# Patient Record
Sex: Female | Born: 1990 | Race: Black or African American | Hispanic: No | Marital: Single | Smoking: Current every day smoker
Health system: Southern US, Community
[De-identification: ages and names within clinical notes are randomized; demographics above are authoritative.]

## PROBLEM LIST (undated history)

## (undated) DIAGNOSIS — I10 Essential (primary) hypertension: Secondary | ICD-10-CM

## (undated) HISTORY — PX: NO PAST SURGERIES: SHX2092

---

## 2017-10-04 ENCOUNTER — Encounter (HOSPITAL_BASED_OUTPATIENT_CLINIC_OR_DEPARTMENT_OTHER): Payer: Self-pay | Admitting: Emergency Medicine

## 2017-10-04 ENCOUNTER — Other Ambulatory Visit: Payer: Self-pay

## 2017-10-04 ENCOUNTER — Emergency Department (HOSPITAL_BASED_OUTPATIENT_CLINIC_OR_DEPARTMENT_OTHER)
Admission: EM | Admit: 2017-10-04 | Discharge: 2017-10-04 | Disposition: A | Discharge status: Home / Self Care | Payer: Self-pay | Attending: Emergency Medicine | Admitting: Emergency Medicine

## 2017-10-04 DIAGNOSIS — N939 Abnormal uterine and vaginal bleeding, unspecified: Secondary | ICD-10-CM | POA: Insufficient documentation

## 2017-10-04 DIAGNOSIS — F1210 Cannabis abuse, uncomplicated: Secondary | ICD-10-CM | POA: Insufficient documentation

## 2017-10-04 DIAGNOSIS — Z87891 Personal history of nicotine dependence: Secondary | ICD-10-CM | POA: Insufficient documentation

## 2017-10-04 DIAGNOSIS — F418 Other specified anxiety disorders: Secondary | ICD-10-CM | POA: Insufficient documentation

## 2017-10-04 DIAGNOSIS — I10 Essential (primary) hypertension: Secondary | ICD-10-CM | POA: Insufficient documentation

## 2017-10-04 DIAGNOSIS — R1033 Periumbilical pain: Secondary | ICD-10-CM | POA: Insufficient documentation

## 2017-10-04 DIAGNOSIS — K5900 Constipation, unspecified: Secondary | ICD-10-CM | POA: Insufficient documentation

## 2017-10-04 DIAGNOSIS — Z79899 Other long term (current) drug therapy: Secondary | ICD-10-CM | POA: Insufficient documentation

## 2017-10-04 HISTORY — DX: Essential (primary) hypertension: I10

## 2017-10-04 LAB — CBC WITH DIFFERENTIAL/PLATELET
BASOS PCT: 0 %
Basophils Absolute: 0 10*3/uL (ref 0.0–0.1)
Eosinophils Absolute: 0 10*3/uL (ref 0.0–0.7)
Eosinophils Relative: 0 %
HEMATOCRIT: 36.1 % (ref 36.0–46.0)
HEMOGLOBIN: 12.4 g/dL (ref 12.0–15.0)
Lymphocytes Relative: 26 %
Lymphs Abs: 1.9 10*3/uL (ref 0.7–4.0)
MCH: 30.5 pg (ref 26.0–34.0)
MCHC: 34.3 g/dL (ref 30.0–36.0)
MCV: 88.7 fL (ref 78.0–100.0)
MONO ABS: 0.6 10*3/uL (ref 0.1–1.0)
MONOS PCT: 8 %
NEUTROS ABS: 4.9 10*3/uL (ref 1.7–7.7)
NEUTROS PCT: 66 %
Platelets: 224 10*3/uL (ref 150–400)
RBC: 4.07 MIL/uL (ref 3.87–5.11)
RDW: 11.7 % (ref 11.5–15.5)
WBC: 7.4 10*3/uL (ref 4.0–10.5)

## 2017-10-04 LAB — COMPREHENSIVE METABOLIC PANEL
ALBUMIN: 4.8 g/dL (ref 3.5–5.0)
ALT: 15 U/L (ref 14–54)
ANION GAP: 11 (ref 5–15)
AST: 32 U/L (ref 15–41)
Alkaline Phosphatase: 56 U/L (ref 38–126)
BUN: 11 mg/dL (ref 6–20)
CHLORIDE: 104 mmol/L (ref 101–111)
CO2: 23 mmol/L (ref 22–32)
Calcium: 9.6 mg/dL (ref 8.9–10.3)
Creatinine, Ser: 0.79 mg/dL (ref 0.44–1.00)
GFR calc non Af Amer: >60 mL/min (ref >60)
GLUCOSE: 105 mg/dL — AB (ref 65–99)
POTASSIUM: 3.3 mmol/L — AB (ref 3.5–5.1)
SODIUM: 138 mmol/L (ref 135–145)
Total Bilirubin: 0.7 mg/dL (ref 0.3–1.2)
Total Protein: 9.1 g/dL — ABNORMAL HIGH (ref 6.5–8.1)

## 2017-10-04 LAB — URINALYSIS, MICROSCOPIC (REFLEX)

## 2017-10-04 LAB — LIPASE, BLOOD: LIPASE: 27 U/L (ref 11–51)

## 2017-10-04 LAB — WET PREP, GENITAL
Sperm: NONE SEEN
TRICH WET PREP: NONE SEEN
Yeast Wet Prep HPF POC: NONE SEEN

## 2017-10-04 LAB — URINALYSIS, ROUTINE W REFLEX MICROSCOPIC
Bilirubin Urine: NEGATIVE
GLUCOSE, UA: NEGATIVE mg/dL
KETONES UR: NEGATIVE mg/dL
LEUKOCYTES UA: NEGATIVE
NITRITE: NEGATIVE
PH: 6.5 (ref 5.0–8.0)
Protein, ur: NEGATIVE mg/dL

## 2017-10-04 MED ORDER — HYDROXYZINE HCL 25 MG PO TABS: 25.0000 mg | ORAL_TABLET | Freq: Four times a day (QID) | ORAL | 0 refills | Status: DC

## 2017-10-04 MED ORDER — LORAZEPAM 1 MG PO TABS
1.0000 mg | ORAL_TABLET | Freq: Once | ORAL | Status: AC
  Administered 2017-10-04: 1 mg via ORAL
  Filled 2017-10-04: qty 1

## 2017-10-04 NOTE — ED Provider Notes (Signed)
[facility] [facility] EMERGENCY DEPARTMENT Provider Note   CSN: [id number] Arrival date & time: 10/04/17  1640     History   Chief Complaint Chief Complaint  Patient presents with  . Anxiety    HPI Teresa Roman is a 27 y.o. female who presents with anxiety. PMH significant for HTN. Her boyfriend is at bedside. She states she has had feelings of restlessness, depression, and paranoia about a medical condition for the past month. Over the past day her symptoms have acutely worsened. She had an episode of chest pressure and SOB earlier which has subsided. She states this was a "panic attack". She thinks her symptoms may be due to the Nexplanon in her arm which she has had for over a year. She has also had periumbilical abdominal pain, constipation, and vaginal bleeding for the past month. She has been drinking excessive amounts of water to stay hydrated. The patient is a difficult historian with various symptoms/complaints and I am unable to get many details out of her history. She denies SI/HI, AVH. No prior psychiatric diagnosis  HPI  Past Medical History:  Diagnosis Date  . Hypertension     There are no active problems to display for this patient.   History reviewed. No pertinent surgical history.  OB History    No data available       Home Medications    Prior to Admission medications   Medication Sig Start Date End Date Taking? Authorizing Provider  LISINOPRIL PO Take by mouth.   Yes [provider]    Family History History reviewed. No pertinent family history.  Social History Social History   Tobacco Use  . Smoking status: Former Research scientist (life sciences)  . Smokeless tobacco: Never Used  Substance Use Topics  . Alcohol use: Yes    Comment: occ  . Drug use: Yes    Types: Marijuana     Allergies   Patient has no known allergies.   Review of Systems Review of Systems  Constitutional: Positive for appetite change. Negative for chills and fever.  HENT:  Negative for congestion and ear pain.   Respiratory: Positive for shortness of breath. Negative for cough.   Cardiovascular: Positive for chest pain.  Gastrointestinal: Positive for abdominal pain and constipation. Negative for diarrhea, nausea and vomiting.  Endocrine: Positive for polydipsia.  Genitourinary: Negative for decreased urine volume and dysuria.  Neurological: Negative for syncope and headaches.  Psychiatric/Behavioral: Positive for sleep disturbance. The patient is nervous/anxious.      Physical Exam Updated Vital Signs BP (!) 150/115 (BP Location: Right Arm)   Pulse 90   Temp 99.3 F (37.4 C) (Oral)   Resp 18   LMP 09/09/2017   SpO2 99%   Physical Exam  Constitutional: She is oriented to person, place, and time. She appears well-developed and well-nourished. She is cooperative. No distress.  HENT:  Head: Normocephalic and atraumatic.  Eyes: Conjunctivae are normal. Pupils are equal, round, and reactive to light. Right eye exhibits no discharge. Left eye exhibits no discharge. No scleral icterus.  Neck: Normal range of motion.  Cardiovascular: Normal rate and regular rhythm. Exam reveals no gallop and no friction rub.  No murmur heard. Pulmonary/Chest: Effort normal and breath sounds normal. No stridor. No respiratory distress. She has no wheezes. She has no rales. She exhibits no tenderness.  Abdominal: Soft. Bowel sounds are normal. She exhibits no distension. There is tenderness (generalized).  Genitourinary:  Genitourinary Comments: Pelvic: No inguinal lymphadenopathy or inguinal hernia noted. Normal  external genitalia. No pain with speculum insertion. Closed cervical os with normal appearance - no rash or lesions. No significant discharge or bleeding noted from cervix or in vaginal vault. On bimanual examination no adnexal tenderness or cervical motion tenderness. Chaperone present during exam.    Neurological: She is alert and oriented to person, place, and time.   Skin: Skin is warm and dry.  Psychiatric: She has a normal mood and affect. Her behavior is normal.  Nursing note and vitals reviewed.   ED Treatments / Results  Labs (all labs ordered are listed, but only abnormal results are displayed) Labs Reviewed  WET PREP, GENITAL - Abnormal; Notable for the following components:      Result Value   Clue Cells Wet Prep HPF POC PRESENT (*)    WBC, Wet Prep HPF POC FEW (*)    All other components within normal limits  COMPREHENSIVE METABOLIC PANEL - Abnormal; Notable for the following components:   Potassium 3.3 (*)    Glucose, Bld 105 (*)    Total Protein 9.1 (*)    All other components within normal limits  URINALYSIS, ROUTINE W REFLEX MICROSCOPIC - Abnormal; Notable for the following components:   Specific Gravity, Urine <1.005 (*)    Hgb urine dipstick MODERATE (*)    All other components within normal limits  URINALYSIS, MICROSCOPIC (REFLEX) - Abnormal; Notable for the following components:   Bacteria, UA RARE (*)    Squamous Epithelial / LPF 0-5 (*)    All other components within normal limits  CBC WITH DIFFERENTIAL/PLATELET  LIPASE, BLOOD  GC/CHLAMYDIA PROBE AMP ([place]) NOT AT [facility]    EKG  EKG Interpretation None       Radiology No results found.  Procedures Procedures (including critical care time)  Medications Ordered in ED Medications  LORazepam (ATIVAN) tablet 1 mg (1 mg Oral Given 10/04/17 1929)     Initial Impression / Assessment and Plan / ED Course  I have reviewed the triage vital signs and the nursing notes.  Pertinent labs & imaging results that were available during my care of the patient were reviewed by me and considered in my medical decision making (see chart for details).  27 year old female presents with restlessness/anxiety. She is hypertensive but otherwise vitals are normal. Exam is overall unremarkable. CBC is normal. CMP is remarkable for mild hypokalemia. UA is remarkable for low  specific gravity (<1.005). Wet prep is remarkable for clue cells and few WBC. She was given Ativan with good relief of anxiety/restlessness symptoms. She was offered psych eval but declined. She was given referral to [facility] and resources for outpatient counseling. She was given an rx for Atarax. Return precautions given.  Final Clinical Impressions(s) / ED Diagnoses   Final diagnoses:  Anxiety about health    ED Discharge Orders    None       Recardo Evangelist, PA-C 10/04/17 2311    Blanchie Dessert, MD 10/04/17 2318

## 2017-10-04 NOTE — Discharge Instructions (Signed)
Take Hydroxyzine as needed for anxiety or insomnia

## 2017-10-04 NOTE — ED Notes (Addendum)
Pt tossing and turning back and forth on stretcher. Laughing continuously and reporting several different complaints. Family member very supportive at bedside.

## 2017-10-04 NOTE — ED Triage Notes (Signed)
Pt c/o anxiety and fidgeting today; sts she can't sit still; reports abd pain and chest pressure

## 2017-10-05 ENCOUNTER — Emergency Department (HOSPITAL_BASED_OUTPATIENT_CLINIC_OR_DEPARTMENT_OTHER)
Admission: EM | Admit: 2017-10-05 | Discharge: 2017-10-05 | Disposition: A | Discharge status: Home / Self Care | Payer: Self-pay | Attending: Emergency Medicine | Admitting: Emergency Medicine

## 2017-10-05 ENCOUNTER — Other Ambulatory Visit: Payer: Self-pay

## 2017-10-05 ENCOUNTER — Encounter (HOSPITAL_BASED_OUTPATIENT_CLINIC_OR_DEPARTMENT_OTHER): Payer: Self-pay | Admitting: Adult Health

## 2017-10-05 DIAGNOSIS — K5900 Constipation, unspecified: Secondary | ICD-10-CM | POA: Insufficient documentation

## 2017-10-05 DIAGNOSIS — R1084 Generalized abdominal pain: Secondary | ICD-10-CM | POA: Insufficient documentation

## 2017-10-05 DIAGNOSIS — Z87891 Personal history of nicotine dependence: Secondary | ICD-10-CM | POA: Insufficient documentation

## 2017-10-05 DIAGNOSIS — Z79899 Other long term (current) drug therapy: Secondary | ICD-10-CM | POA: Insufficient documentation

## 2017-10-05 DIAGNOSIS — I10 Essential (primary) hypertension: Secondary | ICD-10-CM | POA: Insufficient documentation

## 2017-10-05 DIAGNOSIS — F1210 Cannabis abuse, uncomplicated: Secondary | ICD-10-CM | POA: Insufficient documentation

## 2017-10-05 DIAGNOSIS — N939 Abnormal uterine and vaginal bleeding, unspecified: Secondary | ICD-10-CM | POA: Insufficient documentation

## 2017-10-05 DIAGNOSIS — R410 Disorientation, unspecified: Secondary | ICD-10-CM | POA: Insufficient documentation

## 2017-10-05 DIAGNOSIS — M549 Dorsalgia, unspecified: Secondary | ICD-10-CM | POA: Insufficient documentation

## 2017-10-05 DIAGNOSIS — F419 Anxiety disorder, unspecified: Secondary | ICD-10-CM | POA: Insufficient documentation

## 2017-10-05 LAB — GC/CHLAMYDIA PROBE AMP (~~LOC~~) NOT AT ARMC
Chlamydia: NEGATIVE
NEISSERIA GONORRHEA: NEGATIVE

## 2017-10-05 LAB — RAPID URINE DRUG SCREEN, HOSP PERFORMED
AMPHETAMINES: NOT DETECTED
BENZODIAZEPINES: NOT DETECTED
Barbiturates: NOT DETECTED
Cocaine: NOT DETECTED
OPIATES: NOT DETECTED
Tetrahydrocannabinol: POSITIVE — AB

## 2017-10-05 LAB — PREGNANCY, URINE: PREG TEST UR: NEGATIVE

## 2017-10-05 MED ORDER — CAPSAICIN 0.1 % EX CREA: 1.0000 "application " | TOPICAL_CREAM | Freq: Three times a day (TID) | CUTANEOUS | 1 refills | Status: DC | PRN

## 2017-10-05 MED ORDER — ONDANSETRON 4 MG PO TBDP
4.0000 mg | ORAL_TABLET | Freq: Once | ORAL | Status: AC | PRN
  Administered 2017-10-05: 4 mg via ORAL
  Filled 2017-10-05: qty 1

## 2017-10-05 MED ORDER — LORAZEPAM 1 MG PO TABS
1.0000 mg | ORAL_TABLET | Freq: Once | ORAL | Status: AC
  Administered 2017-10-05: 1 mg via ORAL
  Filled 2017-10-05: qty 1

## 2017-10-05 MED FILL — CAPZASIN-HP 0.1% CREAM: 0.1 | 14 days supply | Qty: 43 | Fill #0

## 2017-10-05 MED FILL — hydrOXYzine HCL 25 MG TABS: 25 | 3 days supply | Qty: 12 | Fill #0

## 2017-10-05 NOTE — ED Provider Notes (Signed)
[place] [facility] EMERGENCY DEPARTMENT Provider Note   CSN: [id number] Arrival date & time: 10/05/17  1217     History   Chief Complaint Chief Complaint  Patient presents with  . Anxiety    HPI Teresa Roman is a 27 y.o. female.  HPI Patient was seen yesterday.  She is still having symptoms.  She did not yet fill the hydroxyzine prescribed yesterday.  She reports she does feel generally anxious all over.  Stomach is fluttering.  She gets cramping lower abdominal and back pain.  Patient and her boyfriend reports they did stop smoking marijuana about 2 weeks ago and he wonders if this could be some sort of withdrawal.  There is been no diarrhea.  Patient reports some constipation.  She does have a Nexplanon which she has had for about a year.  She reports that just over the past month she started having some spotting and vaginal bleeding periodically.  Her boyfriend reports sometimes she seems confused or does not remember things.  She just moved here and not establish medical care yet.  Patient has history of hypertension. Past Medical History:  Diagnosis Date  . Hypertension     There are no active problems to display for this patient.   History reviewed. No pertinent surgical history.  OB History    No data available       Home Medications    Prior to Admission medications   Medication Sig Start Date End Date Taking? Authorizing Provider  Capsaicin 0.1 % CREA Apply 1 application topically 3 (three) times daily as needed. 10/05/17   Charlesetta Shanks, MD  hydrOXYzine (ATARAX/VISTARIL) 25 MG tablet Take 1 tablet (25 mg total) by mouth every 6 (six) hours. 10/04/17   Recardo Evangelist, PA-C  LISINOPRIL PO Take by mouth.    [provider]    Family History History reviewed. No pertinent family history.  Social History Social History   Tobacco Use  . Smoking status: Former Research scientist (life sciences)  . Smokeless tobacco: Never Used  Substance Use Topics  . Alcohol use: Yes      Comment: occ  . Drug use: Yes    Types: Marijuana     Allergies   Patient has no known allergies.   Review of Systems Review of Systems  10 Systems reviewed and are negative for acute change except as noted in the HPI.  Physical Exam Updated Vital Signs BP (!) 134/95 (BP Location: Left Arm)   Pulse 89   Temp 98.5 F (36.9 C) (Oral)   Resp 18   LMP 09/09/2017   SpO2 100%   Physical Exam  Constitutional: She is oriented to person, place, and time.  Patient is alert.  She is anxious in appearance.  She stands up and then sits down.  She moves about and sits on her boyfriend's lap.  She will put her hands in her face.  Sometimes she will start some hyperventilating.  Patient is nontoxic.  All movements are coordinated and symmetric.  HENT:  Head: Normocephalic and atraumatic.  Nose: Nose normal.  Mouth/Throat: Oropharynx is clear and moist.  Eyes: EOM are normal. Pupils are equal, round, and reactive to light.  4 mm and symmetric.  Brisk responses.  Neck: Neck supple.  Cardiovascular: Normal rate, regular rhythm, normal heart sounds and intact distal pulses.  Pulmonary/Chest: Effort normal and breath sounds normal.  Abdominal: Soft. Bowel sounds are normal. She exhibits no distension.  Patient's abdomen is soft.  There is no guarding.  She endorses some tenderness but it is extremely diffuse.  She keeps rubbing her hands back and forth over her whole abdomen indicate where it uncomfortable.  Musculoskeletal: Normal range of motion. She exhibits no edema or tenderness.  Neurological: She is alert and oriented to person, place, and time. No cranial nerve deficit. She exhibits normal muscle tone. Coordination normal.  Movements are coordinated purposeful symmetric.  Patient does not have tremor.  She is anxious but not exhibiting confusion.  She is up and down from her chair.  Skin: Skin is warm and dry.  Psychiatric:  Very anxious.     ED Treatments / Results   Labs (all labs ordered are listed, but only abnormal results are displayed) Labs Reviewed  RAPID URINE DRUG SCREEN, HOSP PERFORMED - Abnormal; Notable for the following components:      Result Value   Tetrahydrocannabinol POSITIVE (*)    All other components within normal limits  PREGNANCY, URINE    EKG  EKG Interpretation None       Radiology No results found.  Procedures Procedures (including critical care time)  Medications Ordered in ED Medications  ondansetron (ZOFRAN-ODT) disintegrating tablet 4 mg (4 mg Oral Given 10/05/17 1238)  LORazepam (ATIVAN) tablet 1 mg (1 mg Oral Given 10/05/17 1615)     Initial Impression / Assessment and Plan / ED Course  I have reviewed the triage vital signs and the nursing notes.  Pertinent labs & imaging results that were available during my care of the patient were reviewed by me and considered in my medical decision making (see chart for details).      Final Clinical Impressions(s) / ED Diagnoses   Final diagnoses:  Anxiety  Generalized abdominal pain  Recheck after 1 mg of Ativan, the patient is sitting calmly on the roller stool in front of her boyfriend conversing.  She has eye contact and smiles at me as I enter the room.  She is attentive is I am going over discharge instructions and planning.  Diagnostic results from yesterday are within normal limits.  Urine drug screen still test positive for cannabis.  She reports quitting for 2 weeks.  At this time however will provide a prescription for capsaicin that the patient can apply to the abdomen for cramping discomfort if needed.  She is counseled to fill the hydroxyzine as prescribed yesterday if needed for anxiety or panic.  She is counseled on importance of follow-up plan and continued medical care.  ED Discharge Orders        Ordered    Capsaicin 0.1 % CREA  3 times daily PRN     10/05/17 1644       Charlesetta Shanks, MD 10/05/17 1653

## 2017-10-05 NOTE — ED Triage Notes (Addendum)
Presents with same symptoms as last night, pt boyfriend with her, "She is complaining that she can't sleep, that her stomach is fluttering, she has never had anxiety and she gets hesistant and scared to do anything. She is very overwhelmed, being in a new city and bills and taking control of everything" pt speaks very few words and will not make eye contact. They did not get the hydroxyzine filled last night or today because it was too late. Denies thoughts of SI and HI. PT keeps stating that her stomach hurts and she feels nauseated. She has been constipated for the past few days.

## 2020-09-23 ENCOUNTER — Ambulatory Visit (INDEPENDENT_AMBULATORY_CARE_PROVIDER_SITE_OTHER): Payer: Medicaid Other

## 2020-09-23 ENCOUNTER — Other Ambulatory Visit: Payer: Self-pay

## 2020-09-23 VITALS — BP 152/94 | HR 69 | Wt 243.1 lb

## 2020-09-23 DIAGNOSIS — Z3201 Encounter for pregnancy test, result positive: Secondary | ICD-10-CM

## 2020-09-23 DIAGNOSIS — O10919 Unspecified pre-existing hypertension complicating pregnancy, unspecified trimester: Secondary | ICD-10-CM | POA: Diagnosis not present

## 2020-09-23 LAB — CBC
Hematocrit: 34.5 % (ref 34.0–46.6)
Hemoglobin: 11.5 g/dL (ref 11.1–15.9)
MCH: 30.7 pg (ref 26.6–33.0)
MCHC: 33.3 g/dL (ref 31.5–35.7)
MCV: 92 fL (ref 79–97)
Platelets: 209 10*3/uL (ref 150–450)
RBC: 3.74 x10E6/uL — ABNORMAL LOW (ref 3.77–5.28)
RDW: 11.4 % — ABNORMAL LOW (ref 11.7–15.4)
WBC: 6.7 10*3/uL (ref 3.4–10.8)

## 2020-09-23 LAB — POCT PREGNANCY, URINE: Preg Test, Ur: POSITIVE — AB

## 2020-09-23 MED ORDER — LABETALOL HCL 200 MG PO TABS: 200.0000 mg | ORAL_TABLET | Freq: Three times a day (TID) | ORAL | 3 refills | Status: DC

## 2020-09-23 NOTE — Progress Notes (Signed)
Pt here today for pregnancy test resulting positive.  Pt reports that she thinks her LMP is sometime in October she does not keep track of her periods.  Medications/allergies reviewed.  Pt reports having headaches for the past couple of days that Tylenol is effective pain management.   BP LA 163/92.  Rpt BP RA 152/94.  Reviewed BP's with Dr. Rip Harbour who recommends that pt get a CBC, TSH, CMP, PCR, HgbA1C, and start Labetalol 200 mg po tid.  Due to pt unsure of LMP, U/S scheduled for 10/05/20 @ 1045.  Pt informed of provider's recommendation to start taking BP medication.  Pt provided with office BP cuff so that she can start monitoring her BP's at home 2-3 x per week, writing them down, and bringing them to her appts per recommendation of Dr. Rip Harbour.  Pt advised that we will schedule her a NEW [facility]OB appt in about 2-3 weeks due to her chronic HTN.  Pt verbalized understanding.  Mel Almond, RN 1/13/220

## 2020-09-23 NOTE — Progress Notes (Signed)
Agree with A & P. 

## 2020-09-24 LAB — COMPREHENSIVE METABOLIC PANEL
ALT: 7 IU/L (ref 0–32)
AST: 12 IU/L (ref 0–40)
Albumin/Globulin Ratio: 1.4 (ref 1.2–2.2)
Albumin: 4.2 g/dL (ref 3.8–4.8)
Alkaline Phosphatase: 47 IU/L (ref 44–121)
BUN/Creatinine Ratio: 11 (ref 9–23)
BUN: 7 mg/dL (ref 6–20)
Bilirubin Total: 0.3 mg/dL (ref 0.0–1.2)
CO2: 21 mmol/L (ref 20–29)
Calcium: 9.5 mg/dL (ref 8.7–10.2)
Chloride: 101 mmol/L (ref 96–106)
Creatinine, Ser: 0.65 mg/dL (ref 0.57–1.00)
GFR calc Af Amer: 137 mL/min/{1.73_m2} (ref >59)
GFR calc non Af Amer: 119 mL/min/{1.73_m2} (ref >59)
Globulin, Total: 2.9 g/dL (ref 1.5–4.5)
Glucose: 69 mg/dL (ref 65–99)
Potassium: 4.5 mmol/L (ref 3.5–5.2)
Sodium: 137 mmol/L (ref 134–144)
Total Protein: 7.1 g/dL (ref 6.0–8.5)

## 2020-09-24 LAB — HEMOGLOBIN A1C
Est. average glucose Bld gHb Est-mCnc: 85 mg/dL
Hgb A1c MFr Bld: 4.6 % — ABNORMAL LOW (ref 4.8–5.6)

## 2020-09-24 LAB — TSH: TSH: 1.67 u[IU]/mL (ref 0.450–4.500)

## 2020-09-24 LAB — PROTEIN / CREATININE RATIO, URINE
Creatinine, Urine: 75 mg/dL
Protein, Ur: 8.6 mg/dL
Protein/Creat Ratio: 115 mg/g creat (ref 0–200)

## 2020-10-05 ENCOUNTER — Other Ambulatory Visit: Payer: Self-pay

## 2020-10-05 ENCOUNTER — Ambulatory Visit (HOSPITAL_BASED_OUTPATIENT_CLINIC_OR_DEPARTMENT_OTHER): Payer: Medicaid Other

## 2020-10-05 ENCOUNTER — Other Ambulatory Visit: Payer: Self-pay | Admitting: Obstetrics and Gynecology

## 2020-10-05 ENCOUNTER — Ambulatory Visit
Admission: RE | Admit: 2020-10-05 | Discharge: 2020-10-05 | Disposition: A | Discharge status: Home / Self Care | Payer: Medicaid Other | Source: Ambulatory Visit | Attending: Obstetrics and Gynecology | Admitting: Obstetrics and Gynecology

## 2020-10-05 ENCOUNTER — Ambulatory Visit: Payer: Medicaid Other | Admitting: *Deleted

## 2020-10-05 ENCOUNTER — Other Ambulatory Visit: Payer: Self-pay | Admitting: *Deleted

## 2020-10-05 VITALS — BP 142/86 | HR 75 | Ht 66.0 in

## 2020-10-05 DIAGNOSIS — O10919 Unspecified pre-existing hypertension complicating pregnancy, unspecified trimester: Secondary | ICD-10-CM | POA: Diagnosis not present

## 2020-10-05 DIAGNOSIS — I10 Essential (primary) hypertension: Secondary | ICD-10-CM | POA: Diagnosis not present

## 2020-10-05 DIAGNOSIS — Z3201 Encounter for pregnancy test, result positive: Secondary | ICD-10-CM | POA: Diagnosis not present

## 2020-10-05 DIAGNOSIS — O99212 Obesity complicating pregnancy, second trimester: Secondary | ICD-10-CM | POA: Insufficient documentation

## 2020-10-05 DIAGNOSIS — Z3A17 17 weeks gestation of pregnancy: Secondary | ICD-10-CM

## 2020-10-05 DIAGNOSIS — Z362 Encounter for other antenatal screening follow-up: Secondary | ICD-10-CM

## 2020-10-15 ENCOUNTER — Ambulatory Visit (INDEPENDENT_AMBULATORY_CARE_PROVIDER_SITE_OTHER): Payer: Medicaid Other | Admitting: Family Medicine

## 2020-10-15 ENCOUNTER — Other Ambulatory Visit: Payer: Self-pay

## 2020-10-15 ENCOUNTER — Encounter: Payer: Self-pay | Admitting: Family Medicine

## 2020-10-15 ENCOUNTER — Other Ambulatory Visit (HOSPITAL_COMMUNITY)
Admission: RE | Admit: 2020-10-15 | Discharge: 2020-10-15 | Disposition: A | Discharge status: Home / Self Care | Payer: Medicaid Other | Source: Ambulatory Visit | Attending: Family Medicine | Admitting: Family Medicine

## 2020-10-15 DIAGNOSIS — Z98891 History of uterine scar from previous surgery: Secondary | ICD-10-CM | POA: Insufficient documentation

## 2020-10-15 DIAGNOSIS — O0990 Supervision of high risk pregnancy, unspecified, unspecified trimester: Secondary | ICD-10-CM | POA: Diagnosis not present

## 2020-10-15 DIAGNOSIS — Z3482 Encounter for supervision of other normal pregnancy, second trimester: Secondary | ICD-10-CM | POA: Diagnosis not present

## 2020-10-15 DIAGNOSIS — O10919 Unspecified pre-existing hypertension complicating pregnancy, unspecified trimester: Secondary | ICD-10-CM | POA: Insufficient documentation

## 2020-10-15 NOTE — Progress Notes (Signed)
  Subjective:  Teresa Roman is a G1P0000 [redacted]w[redacted]d being seen today for her first obstetrical visit.  Her obstetrical history is significant for [facility]. Was started on labetalol. Is taking baby aspirin. First pregnancy.. Patient does intend to breast feed. Pregnancy history fully reviewed.  Patient reports no complaints.  BP 140/82   Pulse 74   Wt 249 lb (112.9 kg)   BMI 40.19 kg/m   HISTORY: OB History  Gravida Para Term Preterm AB Living  1 0 0 0 0 0  SAB IAB Ectopic Multiple Live Births  0 0 0 0 0    # Outcome Date GA Lbr Len/2nd Weight Sex Delivery Anes PTL Lv  1 Current             Past Medical History:  Diagnosis Date  . Hypertension     Past Surgical History:  Procedure Laterality Date  . NO PAST SURGERIES      Family History  Problem Relation Age of Onset  . Arthritis Mother   . Hypertension Mother   . Cancer Father      Exam  BP 140/82   Pulse 74   Wt 249 lb (112.9 kg)   BMI 40.19 kg/m   Chaperone present during exam  CONSTITUTIONAL: Well-developed, well-nourished female in no acute distress.  HENT:  Normocephalic, atraumatic, External right and left ear normal. Oropharynx is clear and moist EYES: Conjunctivae and EOM are normal. Pupils are equal, round, and reactive to light. No scleral icterus.  NECK: Normal range of motion, supple, no masses.  Normal thyroid.  CARDIOVASCULAR: Normal heart rate noted, regular rhythm RESPIRATORY: Clear to auscultation bilaterally. Effort and breath sounds normal, no problems with respiration noted. BREASTS: declined exam ABDOMEN: Soft, normal bowel sounds, no distention noted.  No tenderness, rebound or guarding.  PELVIC: Normal appearing external genitalia; normal appearing vaginal mucosa and cervix. No abnormal discharge noted. Normal uterine size, no other palpable masses, no uterine or adnexal tenderness. MUSCULOSKELETAL: Normal range of motion. No tenderness.  No cyanosis, clubbing, or edema.  2+ distal  pulses. SKIN: Skin is warm and dry. No rash noted. Not diaphoretic. No erythema. No pallor. NEUROLOGIC: Alert and oriented to person, place, and time. Normal reflexes, muscle tone coordination. No cranial nerve deficit noted. PSYCHIATRIC: Normal mood and affect. Normal behavior. Normal judgment and thought content.    Assessment:    Pregnancy: G1P0000 Patient Active Problem List   Diagnosis Date Noted  . Supervision of high risk pregnancy, antepartum 10/15/2020  . Chronic hypertension affecting pregnancy 10/15/2020      Plan:   1. Supervision of high risk pregnancy, antepartum FHT and FH normal Desires panorama. Discussed practice, collaborative practice with midwives, fellows. [place] already done. PAP today. - CBC/D/Plt+RPR+Rh+ABO+Rub Ab... - Genetic Screening - Culture, OB Urine - Cytology - PAP( [place]) - Babyscripts Schedule Optimization  2. Chronic hypertension affecting pregnancy Continue labetalol, ASA 81mg  Serial [place]. Antenatal testing.     Problem list reviewed and updated. 75% of 30 min visit spent on counseling and coordination of care.     Truett Mainland 10/15/2020

## 2020-10-18 LAB — CYTOLOGY - PAP
Adequacy: ABSENT
Chlamydia: NEGATIVE
Comment: NEGATIVE
Comment: NEGATIVE
Comment: NORMAL
Diagnosis: NEGATIVE
High risk HPV: NEGATIVE
Neisseria Gonorrhea: NEGATIVE

## 2020-10-20 LAB — CBC/D/PLT+RPR+RH+ABO+RUB AB...
Antibody Screen: NEGATIVE
Basophils Absolute: 0 10*3/uL (ref 0.0–0.2)
Basos: 0 %
EOS (ABSOLUTE): 0.1 10*3/uL (ref 0.0–0.4)
Eos: 2 %
HCV Ab: <0.1 s/co ratio (ref 0.0–0.9)
HIV Screen 4th Generation wRfx: NONREACTIVE
Hematocrit: 30.3 % — ABNORMAL LOW (ref 34.0–46.6)
Hemoglobin: 10.2 g/dL — ABNORMAL LOW (ref 11.1–15.9)
Hepatitis B Surface Ag: NEGATIVE
Immature Grans (Abs): 0 10*3/uL (ref 0.0–0.1)
Immature Granulocytes: 0 %
Lymphocytes Absolute: 1.9 10*3/uL (ref 0.7–3.1)
Lymphs: 26 %
MCH: 30.6 pg (ref 26.6–33.0)
MCHC: 33.7 g/dL (ref 31.5–35.7)
MCV: 91 fL (ref 79–97)
Monocytes Absolute: 0.6 10*3/uL (ref 0.1–0.9)
Monocytes: 8 %
Neutrophils Absolute: 4.6 10*3/uL (ref 1.4–7.0)
Neutrophils: 64 %
Platelets: 217 10*3/uL (ref 150–450)
RBC: 3.33 x10E6/uL — ABNORMAL LOW (ref 3.77–5.28)
RDW: 11 % — ABNORMAL LOW (ref 11.7–15.4)
RPR Ser Ql: NONREACTIVE
Rh Factor: POSITIVE
Rubella Antibodies, IGG: 10.8 index (ref >0.99)
WBC: 7.3 10*3/uL (ref 3.4–10.8)

## 2020-10-20 LAB — HCV INTERPRETATION

## 2020-10-21 LAB — URINE CULTURE, OB REFLEX

## 2020-10-21 LAB — CULTURE, OB URINE

## 2020-10-26 ENCOUNTER — Ambulatory Visit: Payer: Medicaid Other | Admitting: *Deleted

## 2020-10-26 ENCOUNTER — Other Ambulatory Visit: Payer: Self-pay | Admitting: *Deleted

## 2020-10-26 ENCOUNTER — Encounter: Payer: Self-pay | Admitting: *Deleted

## 2020-10-26 ENCOUNTER — Ambulatory Visit: Payer: Medicaid Other | Attending: Obstetrics

## 2020-10-26 ENCOUNTER — Other Ambulatory Visit: Payer: Self-pay

## 2020-10-26 DIAGNOSIS — O0990 Supervision of high risk pregnancy, unspecified, unspecified trimester: Secondary | ICD-10-CM

## 2020-10-26 DIAGNOSIS — O10919 Unspecified pre-existing hypertension complicating pregnancy, unspecified trimester: Secondary | ICD-10-CM | POA: Insufficient documentation

## 2020-10-26 DIAGNOSIS — Z362 Encounter for other antenatal screening follow-up: Secondary | ICD-10-CM | POA: Insufficient documentation

## 2020-11-05 ENCOUNTER — Encounter: Payer: Self-pay | Admitting: *Deleted

## 2020-11-12 ENCOUNTER — Other Ambulatory Visit: Payer: Self-pay

## 2020-11-12 ENCOUNTER — Telehealth (INDEPENDENT_AMBULATORY_CARE_PROVIDER_SITE_OTHER): Payer: Medicaid Other | Admitting: Obstetrics & Gynecology

## 2020-11-12 ENCOUNTER — Encounter: Payer: Self-pay | Admitting: Obstetrics & Gynecology

## 2020-11-12 VITALS — BP 138/87 | HR 79

## 2020-11-12 DIAGNOSIS — Q270 Congenital absence and hypoplasia of umbilical artery: Secondary | ICD-10-CM

## 2020-11-12 DIAGNOSIS — O0992 Supervision of high risk pregnancy, unspecified, second trimester: Secondary | ICD-10-CM

## 2020-11-12 DIAGNOSIS — O10912 Unspecified pre-existing hypertension complicating pregnancy, second trimester: Secondary | ICD-10-CM

## 2020-11-12 DIAGNOSIS — O0990 Supervision of high risk pregnancy, unspecified, unspecified trimester: Secondary | ICD-10-CM

## 2020-11-12 DIAGNOSIS — O10919 Unspecified pre-existing hypertension complicating pregnancy, unspecified trimester: Secondary | ICD-10-CM

## 2020-11-12 DIAGNOSIS — Z3A22 22 weeks gestation of pregnancy: Secondary | ICD-10-CM | POA: Diagnosis not present

## 2020-11-12 NOTE — Progress Notes (Signed)
    TELEHEALTH OBSTETRICS VISIT ENCOUNTER NOTE  Provider location: [facility] [employer] [facility] [facility]   I connected with Desma Maxim on 11/12/20 at 10:55 AM EST by telephone at home and verified that I am speaking with the correct person using two identifiers. Of note, unable to do video encounter due to technical difficulties.    I discussed the limitations, risks, security and privacy concerns of performing an evaluation and management service by telephone and the availability of in person appointments. I also discussed with the patient that there may be a patient responsible charge related to this service. The patient expressed understanding and agreed to proceed.  Subjective:  Teresa Roman is a 30 y.o. G1P0000 at [redacted]w[redacted]d being followed for ongoing prenatal care.  She is currently monitored for the following issues for this high-risk pregnancy and has Supervision of high risk pregnancy, antepartum and Chronic hypertension affecting pregnancy on their problem list.  Patient reports had a few hours of chest pain this morning that resolved with massage. Reports fetal movement. Denies any contractions, bleeding or leaking of fluid.   The following portions of the patient's history were reviewed and updated as appropriate: allergies, current medications, past family history, past medical history, past social history, past surgical history and problem list.   Objective:  Blood pressure 138/87, pulse 79. General:  Alert, oriented and cooperative.   Mental Status: Normal mood and affect perceived. Normal judgment and thought content.  Rest of physical exam deferred due to type of encounter  Assessment and Plan:  Pregnancy: G1P0000 at [redacted]w[redacted]d 1. Supervision of high risk pregnancy, antepartum F/u US for growth in 4 weeks Umbilical cord, single artery and vein 2. Chronic hypertension affecting pregnancy Continue labetalol  Preterm labor symptoms and general obstetric  precautions including but not limited to vaginal bleeding, contractions, leaking of fluid and fetal movement were reviewed in detail with the patient.  I discussed the assessment and treatment plan with the patient. The patient was provided an opportunity to ask questions and all were answered. The patient agreed with the plan and demonstrated an understanding of the instructions. The patient was advised to call back or seek an in-person office evaluation/go to [facility] at [facility] for any urgent or concerning symptoms. Please refer to After Visit Summary for other counseling recommendations.   I provided 12 minutes of non-face-to-face time during this encounter.  Return in about 4 weeks (around 12/10/2020) for 2 hr GTT.  Future Appointments  Date Time Provider [place]  12/07/2020  2:15 PM [facility] NURSE [facility]  12/07/2020  2:30 PM [facility] US3 [facility]    Emeterio Reeve, [place] [facility] [place], [place]

## 2020-11-12 NOTE — Patient Instructions (Signed)

## 2020-11-12 NOTE — Progress Notes (Signed)
I connected with  Teresa Roman on 11/12/20 at 10:55 AM EST by MyChart video visit and verified that I am speaking with the correct person using two identifiers.   I discussed the limitations, risks, security and privacy concerns of performing an evaluation and management service by telephone and the availability of in person appointments. I also discussed with the patient that there may be a patient responsible charge related to this service. The patient expressed understanding and agreed to proceed.  [name], CMA 11/12/2020  10:42 AM

## 2020-11-17 ENCOUNTER — Encounter: Payer: Self-pay | Admitting: *Deleted

## 2020-12-07 ENCOUNTER — Ambulatory Visit: Payer: Medicaid Other | Admitting: *Deleted

## 2020-12-07 ENCOUNTER — Ambulatory Visit: Payer: Medicaid Other | Attending: Maternal & Fetal Medicine

## 2020-12-07 ENCOUNTER — Other Ambulatory Visit: Payer: Self-pay | Admitting: *Deleted

## 2020-12-07 ENCOUNTER — Encounter: Payer: Self-pay | Admitting: *Deleted

## 2020-12-07 ENCOUNTER — Other Ambulatory Visit: Payer: Self-pay

## 2020-12-07 DIAGNOSIS — O3412 Maternal care for benign tumor of corpus uteri, second trimester: Secondary | ICD-10-CM | POA: Diagnosis not present

## 2020-12-07 DIAGNOSIS — O10919 Unspecified pre-existing hypertension complicating pregnancy, unspecified trimester: Secondary | ICD-10-CM | POA: Diagnosis not present

## 2020-12-07 DIAGNOSIS — O0990 Supervision of high risk pregnancy, unspecified, unspecified trimester: Secondary | ICD-10-CM

## 2020-12-07 DIAGNOSIS — O321XX0 Maternal care for breech presentation, not applicable or unspecified: Secondary | ICD-10-CM

## 2020-12-07 DIAGNOSIS — E669 Obesity, unspecified: Secondary | ICD-10-CM | POA: Diagnosis not present

## 2020-12-07 DIAGNOSIS — O99212 Obesity complicating pregnancy, second trimester: Secondary | ICD-10-CM | POA: Diagnosis not present

## 2020-12-07 DIAGNOSIS — O10912 Unspecified pre-existing hypertension complicating pregnancy, second trimester: Secondary | ICD-10-CM | POA: Diagnosis not present

## 2020-12-07 DIAGNOSIS — O368920 Maternal care for other specified fetal problems, second trimester, not applicable or unspecified: Secondary | ICD-10-CM | POA: Diagnosis not present

## 2020-12-07 DIAGNOSIS — D259 Leiomyoma of uterus, unspecified: Secondary | ICD-10-CM | POA: Diagnosis not present

## 2020-12-07 DIAGNOSIS — Z3A26 26 weeks gestation of pregnancy: Secondary | ICD-10-CM | POA: Diagnosis not present

## 2021-01-04 ENCOUNTER — Ambulatory Visit: Payer: Medicaid Other | Attending: Obstetrics and Gynecology

## 2021-01-04 ENCOUNTER — Encounter: Payer: Self-pay | Admitting: *Deleted

## 2021-01-04 ENCOUNTER — Other Ambulatory Visit: Payer: Self-pay

## 2021-01-04 ENCOUNTER — Ambulatory Visit: Payer: Medicaid Other | Admitting: *Deleted

## 2021-01-04 DIAGNOSIS — Z362 Encounter for other antenatal screening follow-up: Secondary | ICD-10-CM

## 2021-01-04 DIAGNOSIS — Z3A30 30 weeks gestation of pregnancy: Secondary | ICD-10-CM

## 2021-01-04 DIAGNOSIS — O3413 Maternal care for benign tumor of corpus uteri, third trimester: Secondary | ICD-10-CM

## 2021-01-04 DIAGNOSIS — O289 Unspecified abnormal findings on antenatal screening of mother: Secondary | ICD-10-CM

## 2021-01-04 DIAGNOSIS — O0990 Supervision of high risk pregnancy, unspecified, unspecified trimester: Secondary | ICD-10-CM | POA: Insufficient documentation

## 2021-01-04 DIAGNOSIS — O10919 Unspecified pre-existing hypertension complicating pregnancy, unspecified trimester: Secondary | ICD-10-CM

## 2021-01-04 DIAGNOSIS — O99213 Obesity complicating pregnancy, third trimester: Secondary | ICD-10-CM

## 2021-01-04 DIAGNOSIS — O10913 Unspecified pre-existing hypertension complicating pregnancy, third trimester: Secondary | ICD-10-CM | POA: Diagnosis not present

## 2021-01-04 DIAGNOSIS — D259 Leiomyoma of uterus, unspecified: Secondary | ICD-10-CM

## 2021-01-04 DIAGNOSIS — E669 Obesity, unspecified: Secondary | ICD-10-CM

## 2021-01-05 ENCOUNTER — Other Ambulatory Visit: Payer: Self-pay | Admitting: *Deleted

## 2021-01-05 DIAGNOSIS — Q270 Congenital absence and hypoplasia of umbilical artery: Secondary | ICD-10-CM

## 2021-01-12 ENCOUNTER — Other Ambulatory Visit: Payer: Self-pay | Admitting: *Deleted

## 2021-01-12 DIAGNOSIS — O0990 Supervision of high risk pregnancy, unspecified, unspecified trimester: Secondary | ICD-10-CM

## 2021-01-13 ENCOUNTER — Ambulatory Visit (INDEPENDENT_AMBULATORY_CARE_PROVIDER_SITE_OTHER): Payer: Medicaid Other | Admitting: Family Medicine

## 2021-01-13 ENCOUNTER — Other Ambulatory Visit: Payer: Medicaid Other

## 2021-01-13 ENCOUNTER — Other Ambulatory Visit: Payer: Self-pay

## 2021-01-13 VITALS — BP 136/80 | HR 90 | Wt 270.0 lb

## 2021-01-13 DIAGNOSIS — Z23 Encounter for immunization: Secondary | ICD-10-CM

## 2021-01-13 DIAGNOSIS — Q270 Congenital absence and hypoplasia of umbilical artery: Secondary | ICD-10-CM

## 2021-01-13 DIAGNOSIS — O10919 Unspecified pre-existing hypertension complicating pregnancy, unspecified trimester: Secondary | ICD-10-CM

## 2021-01-13 DIAGNOSIS — O0930 Supervision of pregnancy with insufficient antenatal care, unspecified trimester: Secondary | ICD-10-CM

## 2021-01-13 DIAGNOSIS — O0990 Supervision of high risk pregnancy, unspecified, unspecified trimester: Secondary | ICD-10-CM

## 2021-01-13 MED ORDER — ASPIRIN 81 MG PO CHEW
81.0000 mg | CHEWABLE_TABLET | Freq: Every day | ORAL | Status: DC
Start: 2021-01-13 — End: 2021-03-08

## 2021-01-13 MED ORDER — LABETALOL HCL 200 MG PO TABS: 400.0000 mg | ORAL_TABLET | Freq: Two times a day (BID) | ORAL | 3 refills | Status: DC

## 2021-01-13 NOTE — Progress Notes (Signed)
   PRENATAL VISIT NOTE  Subjective:  Teresa Roman is a 30 y.o. G1P0000 at [redacted]w[redacted]d being seen today for ongoing prenatal care.  She is currently monitored for the following issues for this high-risk pregnancy and has Supervision of high risk pregnancy, antepartum; Chronic hypertension affecting pregnancy; Umbilical cord, single artery and vein; and Late prenatal care on their problem list.  Patient reports no complaints.  Contractions: Not present. Vag. Bleeding: None.  Movement: Present. Denies leaking of fluid.   The following portions of the patient's history were reviewed and updated as appropriate: allergies, current medications, past family history, past medical history, past social history, past surgical history and problem list.   Objective:   Vitals:   01/13/21 0916  BP: 136/80  Pulse: 90  Weight: 270 lb (122.5 kg)    Fetal Status: Fetal Heart Rate (bpm): 154   Movement: Present     General:  Alert, oriented and cooperative. Patient is in no acute distress.  Skin: Skin is warm and dry. No rash noted.   Cardiovascular: Normal heart rate noted  Respiratory: Normal respiratory effort, no problems with respiration noted  Abdomen: Soft, gravid, appropriate for gestational age.  Pain/Pressure: Absent     Pelvic: Cervical exam deferred        Extremities: Normal range of motion.  Edema: None  Mental Status: Normal mood and affect. Normal behavior. Normal judgment and thought content.   Assessment and Plan:  Pregnancy: G1P0000 at [redacted]w[redacted]d 1. Umbilical cord, single artery and vein Following with MFM for serial growth u/s, next scheduled 01/18/21.  2. Supervision of high risk pregnancy, antepartum -Doing well without complaints -BP elevated, otherwise VSS -taking PNV and bASA -Discussed contraception, undecided at this time, has previously had nexplanon and did not like. Considering liletta vs OCPs vs depo. Will refer to bedsider.org -Received Tdap today, will return for 28 week labs  given not fasting.  3. Chronic hypertension affecting pregnancy -Currently prescribed labetalol 200 mg TID, only able to remember to take 2 doses daily. -Asymptomatic -Today BP mildly elevated, patient needs refill of medication, will switch to labetalol 400 mg BID for compliance -Will collect repeat preE labs with 28 week labs  3. Maternal obesity -BMI 43  Preterm labor symptoms and general obstetric precautions including but not limited to vaginal bleeding, contractions, leaking of fluid and fetal movement were reviewed in detail with the patient. Please refer to After Visit Summary for other counseling recommendations.   Return in about 2 weeks (around 01/27/2021) for [facility]; in person.  Future Appointments  Date Time Provider [place]  01/14/2021  8:20 AM [facility] LAB [facility]  01/18/2021  2:15 PM [facility] NURSE [facility]  01/18/2021  2:30 PM [facility] US3 [facility]  01/24/2021 12:30 PM [facility] NURSE [facility]  01/24/2021 12:45 PM [facility] US6 [facility]  02/02/2021  2:15 PM [facility] NURSE [facility]  02/02/2021  2:30 PM [facility] US3 [facility] [place]    Arrie Senate, MD

## 2021-01-13 NOTE — Patient Instructions (Addendum)
-continue baby aspirin daily -take labetalol 400 mg twice daily -come back tomorrow for lab work, fasting -bedsider.org to look up birth control options  Tdap (Tetanus, Diphtheria, Pertussis) Vaccine: What You Need to Know 1. Why get vaccinated? Tdap vaccine can prevent tetanus, diphtheria, and pertussis. Diphtheria and pertussis spread from person to person. Tetanus enters the body through cuts or wounds.  TETANUS (T) causes painful stiffening of the muscles. Tetanus can lead to serious health problems, including being unable to open the mouth, having trouble swallowing and breathing, or death.  DIPHTHERIA (D) can lead to difficulty breathing, heart failure, paralysis, or death.  PERTUSSIS (aP), also known as "whooping cough," can cause uncontrollable, violent coughing that makes it hard to breathe, eat, or drink. Pertussis can be extremely serious especially in babies and young children, causing pneumonia, convulsions, brain damage, or death. In teens and adults, it can cause weight loss, loss of bladder control, passing out, and rib fractures from severe coughing. 2. Tdap vaccine Tdap is only for children 7 years and older, adolescents, and adults.  Adolescents should receive a single dose of Tdap, preferably at age 64 or 45 years. Pregnant people should get a dose of Tdap during every pregnancy, preferably during the early part of the third trimester, to help protect the newborn from pertussis. Infants are most at risk for severe, life-threatening complications from pertussis. Adults who have never received Tdap should get a dose of Tdap. Also, adults should receive a booster dose of either Tdap or Td (a different vaccine that protects against tetanus and diphtheria but not pertussis) every 10 years, or after 5 years in the case of a severe or dirty wound or burn. Tdap may be given at the same time as other vaccines. 3. Talk with your health care provider Tell your vaccine provider if the  person getting the vaccine:  Has had an allergic reaction after a previous dose of any vaccine that protects against tetanus, diphtheria, or pertussis, or has any severe, life-threatening allergies  Has had a coma, decreased level of consciousness, or prolonged seizures within 7 days after a previous dose of any pertussis vaccine (DTP, DTaP, or Tdap)  Has seizures or another nervous system problem  Has ever had Guillain-Barr Syndrome (also called "GBS")  Has had severe pain or swelling after a previous dose of any vaccine that protects against tetanus or diphtheria In some cases, your health care provider may decide to postpone Tdap vaccination until a future visit. People with minor illnesses, such as a cold, may be vaccinated. People who are moderately or severely ill should usually wait until they recover before getting Tdap vaccine.  Your health care provider can give you more information. 4. Risks of a vaccine reaction  Pain, redness, or swelling where the shot was given, mild fever, headache, feeling tired, and nausea, vomiting, diarrhea, or stomachache sometimes happen after Tdap vaccination. People sometimes faint after medical procedures, including vaccination. Tell your provider if you feel dizzy or have vision changes or ringing in the ears.  As with any medicine, there is a very remote chance of a vaccine causing a severe allergic reaction, other serious injury, or death. 5. What if there is a serious problem? An allergic reaction could occur after the vaccinated person leaves the clinic. If you see signs of a severe allergic reaction (hives, swelling of the face and throat, difficulty breathing, a fast heartbeat, dizziness, or weakness), call 9-1-1 and get the person to the nearest hospital. For other  signs that concern you, call your health care provider.  Adverse reactions should be reported to the Vaccine Adverse Event Reporting System (VAERS). Your health care provider will  usually file this report, or you can do it yourself. Visit the VAERS website at www.vaers.SamedayNews.es or call (604) 433-1080. VAERS is only for reporting reactions, and VAERS staff members do not give medical advice. 6. The National Vaccine Injury Compensation Program The [employer] Vaccine Injury Compensation Program (VICP) is a federal program that was created to compensate people who may have been injured by certain vaccines. Claims regarding alleged injury or death due to vaccination have a time limit for filing, which may be as short as two years. Visit the VICP website at GoldCloset.com.ee or call 276 626 1510 to learn about the program and about filing a claim. 7. How can I learn more?  Ask your health care provider.  Call your local or state health department.  Visit the website of the Food and Drug Administration (FDA) for vaccine package inserts and additional information at TraderRating.uy.  Contact the Centers for Disease Control and Prevention (CDC): ? Call 520-505-8411 (1-800-CDC-INFO) or ? Visit CDC's website at http://hunter.com/. Vaccine Information Statement Tdap (Tetanus, Diphtheria, Pertussis) Vaccine (04/16/2020) This information is not intended to replace advice given to you by your health care provider. Make sure you discuss any questions you have with your health care provider. Document Revised: 05/12/2020 Document Reviewed: 05/12/2020 Elsevier Patient Education  2021 [employer].

## 2021-01-13 NOTE — Progress Notes (Signed)
Patient had checked out before blood draw.                                     Lab

## 2021-01-14 ENCOUNTER — Other Ambulatory Visit: Payer: Self-pay | Admitting: Family Medicine

## 2021-01-14 ENCOUNTER — Other Ambulatory Visit: Payer: Medicaid Other

## 2021-01-14 DIAGNOSIS — O0990 Supervision of high risk pregnancy, unspecified, unspecified trimester: Secondary | ICD-10-CM | POA: Diagnosis not present

## 2021-01-14 DIAGNOSIS — O10919 Unspecified pre-existing hypertension complicating pregnancy, unspecified trimester: Secondary | ICD-10-CM | POA: Diagnosis not present

## 2021-01-14 LAB — CBC
Hematocrit: 29.3 % — ABNORMAL LOW (ref 34.0–46.6)
Hemoglobin: 9.6 g/dL — ABNORMAL LOW (ref 11.1–15.9)
MCH: 30 pg (ref 26.6–33.0)
MCHC: 32.8 g/dL (ref 31.5–35.7)
MCV: 92 fL (ref 79–97)
Platelets: 206 10*3/uL (ref 150–450)
RBC: 3.2 x10E6/uL — ABNORMAL LOW (ref 3.77–5.28)
RDW: 11.8 % (ref 11.7–15.4)
WBC: 7.1 10*3/uL (ref 3.4–10.8)

## 2021-01-14 LAB — PROTEIN / CREATININE RATIO, URINE
Creatinine, Urine: 62.4 mg/dL
Protein, Ur: 10.6 mg/dL
Protein/Creat Ratio: 170 mg/g creat (ref 0–200)

## 2021-01-14 LAB — HIV ANTIBODY (ROUTINE TESTING W REFLEX): HIV Screen 4th Generation wRfx: NONREACTIVE

## 2021-01-14 LAB — RPR: RPR Ser Ql: NONREACTIVE

## 2021-01-14 NOTE — Progress Notes (Signed)
Orders for venofer placed 300 mg weekly x3 weeks. [facility] staff messaged to schedule. myChart message sent to notify patient.  Arrie Senate, MD OB Fellow, [place] for [place] 01/14/2021 3:34 PM

## 2021-01-15 LAB — COMPREHENSIVE METABOLIC PANEL
ALT: 8 IU/L (ref 0–32)
AST: 13 IU/L (ref 0–40)
Albumin/Globulin Ratio: 1.1 — ABNORMAL LOW (ref 1.2–2.2)
Albumin: 3.5 g/dL — ABNORMAL LOW (ref 3.8–4.8)
Alkaline Phosphatase: 59 IU/L (ref 44–121)
BUN/Creatinine Ratio: 10 (ref 9–23)
BUN: 6 mg/dL (ref 6–20)
Bilirubin Total: 0.3 mg/dL (ref 0.0–1.2)
CO2: 17 mmol/L — ABNORMAL LOW (ref 20–29)
Calcium: 9.4 mg/dL (ref 8.7–10.2)
Chloride: 101 mmol/L (ref 96–106)
Creatinine, Ser: 0.63 mg/dL (ref 0.57–1.00)
Globulin, Total: 3.2 g/dL (ref 1.5–4.5)
Glucose: 89 mg/dL (ref 65–99)
Potassium: 4.2 mmol/L (ref 3.5–5.2)
Sodium: 135 mmol/L (ref 134–144)
Total Protein: 6.7 g/dL (ref 6.0–8.5)
eGFR: 122 mL/min/{1.73_m2} (ref >59)

## 2021-01-15 LAB — GLUCOSE TOLERANCE, 2 HOURS W/ 1HR
Glucose, 1 hour: 172 mg/dL (ref 65–179)
Glucose, 2 hour: 109 mg/dL (ref 65–152)
Glucose, Fasting: 91 mg/dL (ref 65–91)

## 2021-01-17 ENCOUNTER — Telehealth: Payer: Self-pay | Admitting: Lactation Services

## 2021-01-17 NOTE — Telephone Encounter (Signed)
-----   Message from Arrie Senate, MD sent at 01/14/2021  3:36 PM EDT ----- Please schedule patient for weekly venofer infusion x3. Thanks!  Arrie Senate, MD OB Fellow, [place] for [place] 01/14/2021 3:35 PM

## 2021-01-17 NOTE — Telephone Encounter (Signed)
Called Short Stay to schedule Venofer Infusion. Patient to receive 300 mg infusions x 3.   1st infusion Scheduled for 5/16 @ 8 am.   Called patient to inform her of Venofer infusion. Was not able to reach her. LM for patient to call the office. My Chart message sent.

## 2021-01-18 ENCOUNTER — Ambulatory Visit: Payer: Medicaid Other

## 2021-01-18 ENCOUNTER — Other Ambulatory Visit: Payer: Medicaid Other

## 2021-01-19 ENCOUNTER — Other Ambulatory Visit: Payer: Medicaid Other

## 2021-01-19 ENCOUNTER — Ambulatory Visit (INDEPENDENT_AMBULATORY_CARE_PROVIDER_SITE_OTHER): Payer: Medicaid Other

## 2021-01-19 ENCOUNTER — Ambulatory Visit: Payer: Medicaid Other | Admitting: *Deleted

## 2021-01-19 ENCOUNTER — Other Ambulatory Visit: Payer: Self-pay

## 2021-01-19 VITALS — BP 131/73 | HR 86 | Wt 273.2 lb

## 2021-01-19 DIAGNOSIS — O10919 Unspecified pre-existing hypertension complicating pregnancy, unspecified trimester: Secondary | ICD-10-CM

## 2021-01-24 ENCOUNTER — Ambulatory Visit: Payer: Medicaid Other

## 2021-01-24 ENCOUNTER — Other Ambulatory Visit: Payer: Self-pay

## 2021-01-24 ENCOUNTER — Encounter (HOSPITAL_COMMUNITY)
Admission: RE | Admit: 2021-01-24 | Discharge: 2021-01-24 | Disposition: A | Discharge status: Home / Self Care | Payer: Medicaid Other | Source: Ambulatory Visit | Attending: Family Medicine | Admitting: Family Medicine

## 2021-01-24 DIAGNOSIS — D649 Anemia, unspecified: Secondary | ICD-10-CM | POA: Diagnosis not present

## 2021-01-24 DIAGNOSIS — O99019 Anemia complicating pregnancy, unspecified trimester: Secondary | ICD-10-CM | POA: Insufficient documentation

## 2021-01-24 DIAGNOSIS — Z3A00 Weeks of gestation of pregnancy not specified: Secondary | ICD-10-CM | POA: Diagnosis not present

## 2021-01-24 MED ORDER — SODIUM CHLORIDE 0.9 % IV SOLN
300.0000 mg | INTRAVENOUS | Status: DC
  Administered 2021-01-24: 300 mg via INTRAVENOUS
  Filled 2021-01-24: qty 15

## 2021-01-24 NOTE — Discharge Instructions (Signed)

## 2021-01-27 ENCOUNTER — Other Ambulatory Visit: Payer: Self-pay

## 2021-01-27 ENCOUNTER — Ambulatory Visit (INDEPENDENT_AMBULATORY_CARE_PROVIDER_SITE_OTHER): Payer: Medicaid Other

## 2021-01-27 ENCOUNTER — Ambulatory Visit: Payer: Medicaid Other | Admitting: *Deleted

## 2021-01-27 ENCOUNTER — Ambulatory Visit (INDEPENDENT_AMBULATORY_CARE_PROVIDER_SITE_OTHER): Payer: Medicaid Other | Admitting: Obstetrics and Gynecology

## 2021-01-27 ENCOUNTER — Other Ambulatory Visit: Payer: Self-pay | Admitting: Lactation Services

## 2021-01-27 VITALS — BP 133/80 | HR 80 | Wt 274.0 lb

## 2021-01-27 DIAGNOSIS — O10919 Unspecified pre-existing hypertension complicating pregnancy, unspecified trimester: Secondary | ICD-10-CM

## 2021-01-27 DIAGNOSIS — O99019 Anemia complicating pregnancy, unspecified trimester: Secondary | ICD-10-CM | POA: Diagnosis not present

## 2021-01-27 DIAGNOSIS — O0990 Supervision of high risk pregnancy, unspecified, unspecified trimester: Secondary | ICD-10-CM | POA: Diagnosis not present

## 2021-01-27 DIAGNOSIS — Z3A00 Weeks of gestation of pregnancy not specified: Secondary | ICD-10-CM | POA: Diagnosis not present

## 2021-01-27 DIAGNOSIS — D649 Anemia, unspecified: Secondary | ICD-10-CM | POA: Diagnosis not present

## 2021-01-27 DIAGNOSIS — Z3A33 33 weeks gestation of pregnancy: Secondary | ICD-10-CM | POA: Insufficient documentation

## 2021-01-27 DIAGNOSIS — Q270 Congenital absence and hypoplasia of umbilical artery: Secondary | ICD-10-CM

## 2021-01-27 LAB — POCT URINALYSIS DIP (DEVICE)
Bilirubin Urine: NEGATIVE
Glucose, UA: NEGATIVE mg/dL
Ketones, ur: NEGATIVE mg/dL
Nitrite: NEGATIVE
Protein, ur: NEGATIVE mg/dL
Specific Gravity, Urine: 1.02 (ref 1.005–1.030)
Urobilinogen, UA: 0.2 mg/dL (ref 0.0–1.0)
pH: 6.5 (ref 5.0–8.0)

## 2021-01-27 NOTE — Progress Notes (Signed)

## 2021-01-27 NOTE — Progress Notes (Signed)
   PRENATAL VISIT NOTE  Subjective:  Teresa Roman is a 30 y.o. G1P0000 at [redacted]w[redacted]d being seen today for ongoing prenatal care.  She is currently monitored for the following issues for this high-risk pregnancy and has Supervision of high risk pregnancy, antepartum; Chronic hypertension affecting pregnancy; Umbilical cord, single artery and vein; Late prenatal care; Morbid obesity ([place]); and [redacted] weeks gestation of pregnancy on their problem list.  Patient doing well with no acute concerns today. She reports no complaints.  Contractions: Not present. Vag. Bleeding: None.  Movement: Present. Denies leaking of fluid.   The following portions of the patient's history were reviewed and updated as appropriate: allergies, current medications, past family history, past medical history, past social history, past surgical history and problem list. Problem list updated.  Objective:   Vitals:   01/27/21 1108  BP: 133/80  Pulse: 80  Weight: 274 lb (124.3 kg)    Fetal Status: Fetal Heart Rate (bpm): 154 Fundal Height: 34 cm Movement: Present     General:  Alert, oriented and cooperative. Patient is in no acute distress.  Skin: Skin is warm and dry. No rash noted.   Cardiovascular: Normal heart rate noted  Respiratory: Normal respiratory effort, no problems with respiration noted  Abdomen: Soft, gravid, appropriate for gestational age.  Pain/Pressure: Absent     Pelvic: Cervical exam deferred        Extremities: Normal range of motion.  Edema: Trace  Mental Status:  Normal mood and affect. Normal behavior. Normal judgment and thought content.   Assessment and Plan:  Pregnancy: G1P0000 at [redacted]w[redacted]d  1. Umbilical cord, single artery and vein   2. Chronic hypertension affecting pregnancy Pt continues with labetalol 400 mg BID  3. Supervision of high risk pregnancy, antepartum   4. Morbid obesity (HCC)   5. [redacted] weeks gestation of pregnancy   Preterm labor symptoms and general obstetric precautions  including but not limited to vaginal bleeding, contractions, leaking of fluid and fetal movement were reviewed in detail with the patient.  Please refer to After Visit Summary for other counseling recommendations.   Return in about 2 weeks (around 02/10/2021) for [facility], in person.   Lynnda Shields, MD Faculty Attending [facility]

## 2021-01-27 NOTE — Addendum Note (Signed)
Addended byMariane Baumgarten on: 01/27/2021 11:50 AM   Modules accepted: Orders

## 2021-01-27 NOTE — Addendum Note (Signed)
Addended by: Langston Reusing on: 01/27/2021 12:38 PM   Modules accepted: Orders

## 2021-01-29 LAB — CULTURE, OB URINE

## 2021-01-29 LAB — URINE CULTURE, OB REFLEX

## 2021-01-31 ENCOUNTER — Encounter (HOSPITAL_COMMUNITY)
Admission: RE | Admit: 2021-01-31 | Discharge: 2021-01-31 | Disposition: A | Discharge status: Home / Self Care | Payer: Medicaid Other | Source: Ambulatory Visit | Attending: Family Medicine | Admitting: Family Medicine

## 2021-01-31 DIAGNOSIS — D649 Anemia, unspecified: Secondary | ICD-10-CM | POA: Diagnosis not present

## 2021-01-31 DIAGNOSIS — O99019 Anemia complicating pregnancy, unspecified trimester: Secondary | ICD-10-CM | POA: Diagnosis not present

## 2021-01-31 DIAGNOSIS — Z3A00 Weeks of gestation of pregnancy not specified: Secondary | ICD-10-CM | POA: Diagnosis not present

## 2021-01-31 MED ORDER — SODIUM CHLORIDE 0.9 % IV SOLN
300.0000 mg | INTRAVENOUS | Status: DC
  Administered 2021-01-31: 300 mg via INTRAVENOUS
  Filled 2021-01-31 (×3): qty 15

## 2021-02-02 ENCOUNTER — Ambulatory Visit: Payer: Medicaid Other | Admitting: *Deleted

## 2021-02-02 ENCOUNTER — Other Ambulatory Visit: Payer: Self-pay

## 2021-02-02 ENCOUNTER — Ambulatory Visit: Payer: Medicaid Other | Attending: Obstetrics and Gynecology

## 2021-02-02 ENCOUNTER — Encounter: Payer: Self-pay | Admitting: *Deleted

## 2021-02-02 DIAGNOSIS — O0990 Supervision of high risk pregnancy, unspecified, unspecified trimester: Secondary | ICD-10-CM | POA: Diagnosis not present

## 2021-02-02 DIAGNOSIS — O3413 Maternal care for benign tumor of corpus uteri, third trimester: Secondary | ICD-10-CM

## 2021-02-02 DIAGNOSIS — O10919 Unspecified pre-existing hypertension complicating pregnancy, unspecified trimester: Secondary | ICD-10-CM

## 2021-02-02 DIAGNOSIS — O99213 Obesity complicating pregnancy, third trimester: Secondary | ICD-10-CM

## 2021-02-02 DIAGNOSIS — D259 Leiomyoma of uterus, unspecified: Secondary | ICD-10-CM | POA: Diagnosis not present

## 2021-02-02 DIAGNOSIS — O359XX0 Maternal care for (suspected) fetal abnormality and damage, unspecified, not applicable or unspecified: Secondary | ICD-10-CM

## 2021-02-02 DIAGNOSIS — E669 Obesity, unspecified: Secondary | ICD-10-CM | POA: Diagnosis not present

## 2021-02-02 DIAGNOSIS — Z3A34 34 weeks gestation of pregnancy: Secondary | ICD-10-CM | POA: Diagnosis not present

## 2021-02-02 DIAGNOSIS — Q270 Congenital absence and hypoplasia of umbilical artery: Secondary | ICD-10-CM | POA: Insufficient documentation

## 2021-02-02 DIAGNOSIS — O10013 Pre-existing essential hypertension complicating pregnancy, third trimester: Secondary | ICD-10-CM | POA: Diagnosis not present

## 2021-02-08 ENCOUNTER — Other Ambulatory Visit: Payer: Self-pay

## 2021-02-08 ENCOUNTER — Encounter (HOSPITAL_COMMUNITY)
Admission: RE | Admit: 2021-02-08 | Discharge: 2021-02-08 | Disposition: A | Discharge status: Home / Self Care | Payer: Medicaid Other | Source: Ambulatory Visit | Attending: Family Medicine | Admitting: Family Medicine

## 2021-02-08 DIAGNOSIS — D649 Anemia, unspecified: Secondary | ICD-10-CM | POA: Diagnosis not present

## 2021-02-08 DIAGNOSIS — Z3A00 Weeks of gestation of pregnancy not specified: Secondary | ICD-10-CM | POA: Diagnosis not present

## 2021-02-08 DIAGNOSIS — O99019 Anemia complicating pregnancy, unspecified trimester: Secondary | ICD-10-CM | POA: Diagnosis not present

## 2021-02-08 MED ORDER — SODIUM CHLORIDE 0.9 % IV SOLN
300.0000 mg | INTRAVENOUS | Status: DC
  Administered 2021-02-08: 300 mg via INTRAVENOUS
  Filled 2021-02-08: qty 15

## 2021-02-10 ENCOUNTER — Encounter: Payer: Medicaid Other | Admitting: Obstetrics and Gynecology

## 2021-02-10 ENCOUNTER — Other Ambulatory Visit: Payer: Medicaid Other

## 2021-02-14 ENCOUNTER — Encounter: Payer: Medicaid Other | Admitting: Family Medicine

## 2021-02-14 ENCOUNTER — Encounter: Payer: Self-pay | Admitting: Family Medicine

## 2021-02-14 DIAGNOSIS — O99019 Anemia complicating pregnancy, unspecified trimester: Secondary | ICD-10-CM | POA: Insufficient documentation

## 2021-02-17 ENCOUNTER — Ambulatory Visit: Payer: Medicaid Other | Admitting: *Deleted

## 2021-02-17 ENCOUNTER — Other Ambulatory Visit (HOSPITAL_COMMUNITY)
Admission: RE | Admit: 2021-02-17 | Discharge: 2021-02-17 | Disposition: A | Discharge status: Home / Self Care | Payer: Medicaid Other | Source: Ambulatory Visit | Attending: Family Medicine | Admitting: Family Medicine

## 2021-02-17 ENCOUNTER — Other Ambulatory Visit: Payer: Self-pay

## 2021-02-17 ENCOUNTER — Ambulatory Visit (INDEPENDENT_AMBULATORY_CARE_PROVIDER_SITE_OTHER): Payer: Medicaid Other | Admitting: Obstetrics and Gynecology

## 2021-02-17 ENCOUNTER — Ambulatory Visit (INDEPENDENT_AMBULATORY_CARE_PROVIDER_SITE_OTHER): Payer: Medicaid Other

## 2021-02-17 VITALS — BP 129/69 | HR 89 | Wt 274.9 lb

## 2021-02-17 DIAGNOSIS — O0990 Supervision of high risk pregnancy, unspecified, unspecified trimester: Secondary | ICD-10-CM | POA: Insufficient documentation

## 2021-02-17 DIAGNOSIS — Z3A36 36 weeks gestation of pregnancy: Secondary | ICD-10-CM | POA: Insufficient documentation

## 2021-02-17 DIAGNOSIS — Q270 Congenital absence and hypoplasia of umbilical artery: Secondary | ICD-10-CM

## 2021-02-17 DIAGNOSIS — O10919 Unspecified pre-existing hypertension complicating pregnancy, unspecified trimester: Secondary | ICD-10-CM

## 2021-02-17 DIAGNOSIS — Z6841 Body Mass Index (BMI) 40.0 and over, adult: Secondary | ICD-10-CM

## 2021-02-17 NOTE — Progress Notes (Signed)
   PRENATAL VISIT NOTE  Subjective:  Teresa Roman is a 30 y.o. G1P0000 at 105w5d being seen today for ongoing prenatal care.  She is currently monitored for the following issues for this high-risk pregnancy and has Supervision of high risk pregnancy, antepartum; Chronic hypertension affecting pregnancy; Umbilical cord, single artery and vein; Late prenatal care; Morbid obesity ([place]); [redacted] weeks gestation of pregnancy; Anemia of pregnancy; [redacted] weeks gestation of pregnancy; and BMI 40.0-44.9, adult ([place]) on their problem list.  Patient doing well with no acute concerns today. She reports backache.  Contractions: Not present. Vag. Bleeding: None.  Movement: Present. Denies leaking of fluid.   The following portions of the patient's history were reviewed and updated as appropriate: allergies, current medications, past family history, past medical history, past social history, past surgical history and problem list. Problem list updated.  Objective:   Vitals:   02/17/21 1328  BP: 129/69  Pulse: 89  Weight: 274 lb 14.4 oz (124.7 kg)    Fetal Status: Fetal Heart Rate (bpm): NST Fundal Height: 37 cm Movement: Present     General:  Alert, oriented and cooperative. Patient is in no acute distress.  Skin: Skin is warm and dry. No rash noted.   Cardiovascular: Normal heart rate noted  Respiratory: Normal respiratory effort, no problems with respiration noted  Abdomen: Soft, gravid, appropriate for gestational age.  Pain/Pressure: Present     Pelvic: Cervical exam performed Dilation: Closed Effacement (%): Thick Station: -3  Extremities: Normal range of motion.  Edema: Trace  Mental Status:  Normal mood and affect. Normal behavior. Normal judgment and thought content.   Assessment and Plan:  Pregnancy: G1P0000 at [redacted]w[redacted]d  1. Supervision of high risk pregnancy, antepartum Continue routine care - GC/Chlamydia probe amp ([place])not at [facility] - Strep Gp B NAA  2. Chronic hypertension affecting  pregnancy Per MFM recommendations, IOL between 37-39 weeks, Pt desires closer to 39 weeks, will schedule 6/24 or 6/25, continue weekly testing  3. Umbilical cord, single artery and vein   4. [redacted] weeks gestation of pregnancy   5. Morbid obesity ([place])   6. BMI 40.0-44.9, adult [facility])   Term labor symptoms and general obstetric precautions including but not limited to vaginal bleeding, contractions, leaking of fluid and fetal movement were reviewed in detail with the patient.  Please refer to After Visit Summary for other counseling recommendations.   Return in about 1 week (around 02/24/2021) for [facility], in person.   Lynnda Shields, MD Faculty Attending [facility]

## 2021-02-17 NOTE — Progress Notes (Signed)
Pt reports back pain.

## 2021-02-18 LAB — GC/CHLAMYDIA PROBE AMP (~~LOC~~) NOT AT ARMC
Chlamydia: NEGATIVE
Comment: NEGATIVE
Comment: NORMAL
Neisseria Gonorrhea: NEGATIVE

## 2021-02-19 LAB — STREP GP B NAA: Strep Gp B NAA: NEGATIVE

## 2021-02-20 ENCOUNTER — Other Ambulatory Visit: Payer: Self-pay | Admitting: Obstetrics and Gynecology

## 2021-02-20 NOTE — Progress Notes (Signed)
IOL of labor orders placed

## 2021-02-22 ENCOUNTER — Encounter: Payer: Self-pay | Admitting: *Deleted

## 2021-02-23 ENCOUNTER — Other Ambulatory Visit: Payer: Self-pay | Admitting: Advanced Practice Midwife

## 2021-02-25 ENCOUNTER — Ambulatory Visit (INDEPENDENT_AMBULATORY_CARE_PROVIDER_SITE_OTHER): Payer: Medicaid Other | Admitting: Medical

## 2021-02-25 ENCOUNTER — Telehealth (HOSPITAL_COMMUNITY): Payer: Self-pay | Admitting: *Deleted

## 2021-02-25 ENCOUNTER — Encounter: Payer: Self-pay | Admitting: Medical

## 2021-02-25 ENCOUNTER — Other Ambulatory Visit: Payer: Self-pay

## 2021-02-25 VITALS — BP 117/80 | HR 88 | Wt 276.2 lb

## 2021-02-25 DIAGNOSIS — O0990 Supervision of high risk pregnancy, unspecified, unspecified trimester: Secondary | ICD-10-CM

## 2021-02-25 DIAGNOSIS — Z5941 Food insecurity: Secondary | ICD-10-CM

## 2021-02-25 DIAGNOSIS — Q270 Congenital absence and hypoplasia of umbilical artery: Secondary | ICD-10-CM

## 2021-02-25 DIAGNOSIS — O0930 Supervision of pregnancy with insufficient antenatal care, unspecified trimester: Secondary | ICD-10-CM

## 2021-02-25 DIAGNOSIS — O10919 Unspecified pre-existing hypertension complicating pregnancy, unspecified trimester: Secondary | ICD-10-CM

## 2021-02-25 DIAGNOSIS — O99013 Anemia complicating pregnancy, third trimester: Secondary | ICD-10-CM

## 2021-02-25 NOTE — Patient Instructions (Signed)
[employer] - to get baby's head into pelvis

## 2021-02-25 NOTE — Telephone Encounter (Signed)
Preadmission screen  

## 2021-02-25 NOTE — Progress Notes (Signed)
Patient is doing well, no questions or concerns

## 2021-02-25 NOTE — Addendum Note (Signed)
Addended by: [name] on: 02/25/2021 11:35 AM   Modules accepted: Orders

## 2021-02-25 NOTE — Progress Notes (Signed)
   PRENATAL VISIT NOTE  Subjective:  Teresa Roman is a 30 y.o. G1P0000 at [redacted]w[redacted]d being seen today for ongoing prenatal care.  She is currently monitored for the following issues for this high-risk pregnancy and has Supervision of high risk pregnancy, antepartum; Chronic hypertension affecting pregnancy; Umbilical cord, single artery and vein; Late prenatal care; Morbid obesity ([place]); Anemia of pregnancy; and BMI 40.0-44.9, adult ([place]) on their problem list.  Patient reports no complaints.  Contractions: Not present. Vag. Bleeding: None.  Movement: Present. Denies leaking of fluid.   The following portions of the patient's history were reviewed and updated as appropriate: allergies, current medications, past family history, past medical history, past social history, past surgical history and problem list.   Objective:   Vitals:   02/25/21 1020  BP: 117/80  Pulse: 88  Weight: 276 lb 3.2 oz (125.3 kg)    Fetal Status: Fetal Heart Rate (bpm): 161   Movement: Present     General:  Alert, oriented and cooperative. Patient is in no acute distress.  Skin: Skin is warm and dry. No rash noted.   Cardiovascular: Normal heart rate noted  Respiratory: Normal respiratory effort, no problems with respiration noted  Abdomen: Soft, gravid, appropriate for gestational age.  Pain/Pressure: Absent     Pelvic: Cervical exam deferred        Extremities: Normal range of motion.  Edema: None  Mental Status: Normal mood and affect. Normal behavior. Normal judgment and thought content.   Assessment and Plan:  Pregnancy: G1P0000 at [redacted]w[redacted]d 1. Supervision of high risk pregnancy, antepartum - Planning [facility] - Planning POPs for MOC - GBS negative discussed   2. Anemia during pregnancy in third trimester - Has had 3 doses of Venofer  3. Morbid obesity ([place])  4. Chronic hypertension affecting pregnancy - Well controlled on Labetalol today - Denies HA, blurred vision today  - IOL already  scheduled for 03/05/21  5. Late prenatal care  6. Umbilical cord, single artery and vein  Term labor symptoms and general obstetric precautions including but not limited to vaginal bleeding, contractions, leaking of fluid and fetal movement were reviewed in detail with the patient. Please refer to After Visit Summary for other counseling recommendations.   Return in about 1 week (around 03/04/2021) for [facility] APP, Virtual.  Future Appointments  Date Time Provider [place]  03/03/2021 10:00 AM MC-SCREENING [facility]-SDSC None  03/05/2021  7:00 AM MC-LD [place] None    Kerry Hough, PA-C

## 2021-03-03 ENCOUNTER — Telehealth (INDEPENDENT_AMBULATORY_CARE_PROVIDER_SITE_OTHER): Payer: Medicaid Other | Admitting: Obstetrics & Gynecology

## 2021-03-03 ENCOUNTER — Other Ambulatory Visit (HOSPITAL_COMMUNITY)
Admission: RE | Admit: 2021-03-03 | Discharge: 2021-03-03 | Disposition: A | Discharge status: Home / Self Care | Payer: Medicaid Other | Source: Ambulatory Visit | Attending: Obstetrics and Gynecology | Admitting: Obstetrics and Gynecology

## 2021-03-03 VITALS — BP 129/79 | HR 85

## 2021-03-03 DIAGNOSIS — O0933 Supervision of pregnancy with insufficient antenatal care, third trimester: Secondary | ICD-10-CM

## 2021-03-03 DIAGNOSIS — Z20822 Contact with and (suspected) exposure to covid-19: Secondary | ICD-10-CM | POA: Insufficient documentation

## 2021-03-03 DIAGNOSIS — Z3A38 38 weeks gestation of pregnancy: Secondary | ICD-10-CM

## 2021-03-03 DIAGNOSIS — O0930 Supervision of pregnancy with insufficient antenatal care, unspecified trimester: Secondary | ICD-10-CM

## 2021-03-03 DIAGNOSIS — O10919 Unspecified pre-existing hypertension complicating pregnancy, unspecified trimester: Secondary | ICD-10-CM

## 2021-03-03 DIAGNOSIS — O99213 Obesity complicating pregnancy, third trimester: Secondary | ICD-10-CM

## 2021-03-03 DIAGNOSIS — O10913 Unspecified pre-existing hypertension complicating pregnancy, third trimester: Secondary | ICD-10-CM | POA: Diagnosis not present

## 2021-03-03 DIAGNOSIS — Z01812 Encounter for preprocedural laboratory examination: Secondary | ICD-10-CM | POA: Diagnosis not present

## 2021-03-03 DIAGNOSIS — O0990 Supervision of high risk pregnancy, unspecified, unspecified trimester: Secondary | ICD-10-CM

## 2021-03-03 LAB — SARS CORONAVIRUS 2 (TAT 6-24 HRS): SARS Coronavirus 2: NEGATIVE

## 2021-03-03 NOTE — Progress Notes (Signed)
    TELEHEALTH OBSTETRICS VISIT ENCOUNTER NOTE  Provider location: [facility] [place] [facility] [place] [facility]   Patient location: Home  I connected with Desma Maxim on 03/03/21 at  8:20 AM EDT by telephone at home and verified that I am speaking with the correct person using two identifiers. Of note, unable to do video encounter due to technical difficulties.    I discussed the limitations, risks, security and privacy concerns of performing an evaluation and management service by telephone and the availability of in person appointments. I also discussed with the patient that there may be a patient responsible charge related to this service. The patient expressed understanding and agreed to proceed.  Subjective:  Teresa Roman is a 30 y.o. G1P0000 at [redacted]w[redacted]d being followed for ongoing prenatal care.  She is currently monitored for the following issues for this high-risk pregnancy and has Supervision of high risk pregnancy, antepartum; Chronic hypertension affecting pregnancy; Umbilical cord, single artery and vein; Late prenatal care; Morbid obesity ([place]); Anemia of pregnancy; and BMI 40.0-44.9, adult ([place]) on their problem list.  Patient reports no complaints. Reports fetal movement. Denies any contractions, bleeding or leaking of fluid.   The following portions of the patient's history were reviewed and updated as appropriate: allergies, current medications, past family history, past medical history, past social history, past surgical history and problem list.   Objective:  Blood pressure 129/79, pulse 85. General:  Alert, oriented and cooperative.   Mental Status: Normal mood and affect perceived. Normal judgment and thought content.  Rest of physical exam deferred due to type of encounter  Assessment and Plan:  Pregnancy: G1P0000 at [redacted]w[redacted]d 1. Chronic hypertension affecting pregnancy Good control on labetalol 2. Late prenatal care   3. Morbid obesity ([place])   4.  Supervision of high risk pregnancy, antepartum Fetal growth is normal  Term labor symptoms and general obstetric precautions including but not limited to vaginal bleeding, contractions, leaking of fluid and fetal movement were reviewed in detail with the patient.  I discussed the assessment and treatment plan with the patient. The patient was provided an opportunity to ask questions and all were answered. The patient agreed with the plan and demonstrated an understanding of the instructions. The patient was advised to call back or seek an in-person office evaluation/go to [facility] at [facility] for any urgent or concerning symptoms. Please refer to After Visit Summary for other counseling recommendations.   I provided 10 minutes of non-face-to-face time during this encounter.  Return if symptoms worsen or fail to improve, for postpartum.  Future Appointments  Date Time Provider [place]  03/03/2021 10:00 AM MC-SCREENING MC-SDSC None  03/05/2021  7:00 AM MC-LD [place] None    Emeterio Reeve, [place] for [facility] [place]

## 2021-03-03 NOTE — Progress Notes (Signed)
I connected with  Desma Maxim on 03/03/21 at  8:20 AM EDT by telephone and verified that I am speaking with the correct person using two identifiers.   I discussed the limitations, risks, security and privacy concerns of performing an evaluation and management service by telephone and the availability of in person appointments. I also discussed with the patient that there may be a patient responsible charge related to this service. The patient expressed understanding and agreed to proceed.  Verdell Carmine, RN 03/03/2021  8:53 AM

## 2021-03-03 NOTE — Patient Instructions (Signed)
Labor Induction Labor induction is when steps are taken to cause a pregnant woman to begin the labor process. Most women go into labor on their own between 37 weeks and 42 weeks of pregnancy. When this does not happen, or when there is a medical needfor labor to begin, steps may be taken to induce, or bring on, labor. Labor induction causes a pregnant woman's uterus to contract. It also causes the cervix to soften (ripen), open (dilate), and thin out. Usually, labor is not induced before 39 weeks of pregnancyunless there is a medical reason to do so. When is labor induction considered? Labor induction may be right for you if: Your pregnancy lasts longer than 41 to 42 weeks. Your placenta is separating from your uterus (placental abruption). You have a rupture of membranes and your labor does not begin. You have health problems, like diabetes or high blood pressure (preeclampsia) during your pregnancy. Your baby has stopped growing or does not have enough amniotic fluid. Before labor induction begins, your health care provider will consider the following factors: Your medical condition and the baby's condition. How many weeks you have been pregnant. How mature the baby's lungs are. The condition of your cervix. The position of the baby. The size of your birth canal. Tell a health care provider about: Any allergies you have. All medicines you are taking, including vitamins, herbs, eye drops, creams, and over-the-counter medicines. Any problems you or your family members have had with anesthetic medicines. Any surgeries you have had. Any blood disorders you have. Any medical conditions you have. What are the risks? Generally, this is a safe procedure. However, problems may occur, including: Failed induction. Changes in fetal heart rate, such as being too high, too low, or irregular (erratic). Infection in the mother or the baby. Increased risk of having a cesarean delivery. Breaking off  (abruption) of the placenta from the uterus. This is rare. Rupture of the uterus. This is very rare. Your baby could fail to get enough blood flow or oxygen. This can be life-threatening. When induction is needed for medical reasons, the benefits generally outweighthe risks. What happens during the procedure? During the procedure, your health care provider will use one of these methods to induce labor: Stripping the membranes. In this method, the amniotic sac tissue is gently separated from the cervix. This causes the following to happen: Your cervix stretches, which in turn causes the release of prostaglandins. Prostaglandins induce labor and cause the uterus to contract. This procedure is often done in an office visit. You will be sent home to wait for contractions to begin. Prostaglandin medicine. This medicine starts contractions and causes the cervix to dilate and ripen. This can be taken by mouth (orally) or by being inserted into the vagina (suppository). Inserting a small, thin tube (catheter) with a balloon into the vagina and then expanding the balloon with water to dilate the cervix. Breaking the water. In this method, a small instrument is used to make a small hole in the amniotic sac. This eventually causes the amniotic sac to break. Contractions should begin within a few hours. Medicine to trigger or strengthen contractions. This medicine is given through an IV that is inserted into a vein in your arm. This procedure may vary among health care providers and hospitals. Where to find more information March of D[employer]: www.marchofdimes.org [employer]: www.acog.org Summary Labor induction causes a pregnant woman's uterus to contract. It also causes the cervix to soften (ripen),  open (dilate), and thin out. Labor is usually not induced before 39 weeks of pregnancy unless there is a medical reason to do so. When induction is needed for medical  reasons, the benefits generally outweigh the risks. Talk with your health care provider about which methods of labor induction are right for you. This information is not intended to replace advice given to you by your health care provider. Make sure you discuss any questions you have with your healthcare provider. Document Revised: 06/10/2020 Document Reviewed: 06/10/2020 Elsevier Patient Education  [place].

## 2021-03-05 ENCOUNTER — Encounter (HOSPITAL_COMMUNITY): Payer: Self-pay | Admitting: Obstetrics and Gynecology

## 2021-03-05 ENCOUNTER — Inpatient Hospital Stay (HOSPITAL_COMMUNITY): Payer: Medicaid Other

## 2021-03-05 ENCOUNTER — Inpatient Hospital Stay (HOSPITAL_COMMUNITY)
Admission: AD | Admit: 2021-03-05 | Discharge: 2021-03-08 | DRG: 787 | Disposition: A | Discharge status: Home / Self Care | Payer: Medicaid Other | Attending: Obstetrics & Gynecology | Admitting: Obstetrics & Gynecology

## 2021-03-05 DIAGNOSIS — O1002 Pre-existing essential hypertension complicating childbirth: Principal | ICD-10-CM | POA: Diagnosis present

## 2021-03-05 DIAGNOSIS — Z3A39 39 weeks gestation of pregnancy: Secondary | ICD-10-CM | POA: Diagnosis not present

## 2021-03-05 DIAGNOSIS — Z87891 Personal history of nicotine dependence: Secondary | ICD-10-CM

## 2021-03-05 DIAGNOSIS — Z6841 Body Mass Index (BMI) 40.0 and over, adult: Secondary | ICD-10-CM

## 2021-03-05 DIAGNOSIS — O10919 Unspecified pre-existing hypertension complicating pregnancy, unspecified trimester: Secondary | ICD-10-CM | POA: Diagnosis present

## 2021-03-05 DIAGNOSIS — D62 Acute posthemorrhagic anemia: Secondary | ICD-10-CM | POA: Diagnosis not present

## 2021-03-05 DIAGNOSIS — D259 Leiomyoma of uterus, unspecified: Secondary | ICD-10-CM | POA: Diagnosis not present

## 2021-03-05 DIAGNOSIS — O3413 Maternal care for benign tumor of corpus uteri, third trimester: Secondary | ICD-10-CM | POA: Diagnosis not present

## 2021-03-05 DIAGNOSIS — Z98891 History of uterine scar from previous surgery: Secondary | ICD-10-CM

## 2021-03-05 DIAGNOSIS — Z7982 Long term (current) use of aspirin: Secondary | ICD-10-CM

## 2021-03-05 DIAGNOSIS — O9081 Anemia of the puerperium: Secondary | ICD-10-CM | POA: Diagnosis not present

## 2021-03-05 DIAGNOSIS — O99214 Obesity complicating childbirth: Secondary | ICD-10-CM | POA: Diagnosis present

## 2021-03-05 DIAGNOSIS — O0990 Supervision of high risk pregnancy, unspecified, unspecified trimester: Secondary | ICD-10-CM

## 2021-03-05 DIAGNOSIS — O99019 Anemia complicating pregnancy, unspecified trimester: Secondary | ICD-10-CM | POA: Diagnosis present

## 2021-03-05 DIAGNOSIS — O134 Gestational [pregnancy-induced] hypertension without significant proteinuria, complicating childbirth: Secondary | ICD-10-CM | POA: Diagnosis not present

## 2021-03-05 LAB — CBC
HCT: 31.5 % — ABNORMAL LOW (ref 36.0–46.0)
Hemoglobin: 10.3 g/dL — ABNORMAL LOW (ref 12.0–15.0)
MCH: 30.7 pg (ref 26.0–34.0)
MCHC: 32.7 g/dL (ref 30.0–36.0)
MCV: 93.8 fL (ref 80.0–100.0)
Platelets: 211 10*3/uL (ref 150–400)
RBC: 3.36 MIL/uL — ABNORMAL LOW (ref 3.87–5.11)
RDW: 12.7 % (ref 11.5–15.5)
WBC: 7.5 10*3/uL (ref 4.0–10.5)
nRBC: 0 % (ref 0.0–0.2)

## 2021-03-05 LAB — TYPE AND SCREEN
ABO/RH(D): A POS
Antibody Screen: NEGATIVE

## 2021-03-05 LAB — COMPREHENSIVE METABOLIC PANEL
ALT: 11 U/L (ref 0–44)
AST: 15 U/L (ref 15–41)
Albumin: 2.9 g/dL — ABNORMAL LOW (ref 3.5–5.0)
Alkaline Phosphatase: 71 U/L (ref 38–126)
Anion gap: 9 (ref 5–15)
BUN: 9 mg/dL (ref 6–20)
CO2: 21 mmol/L — ABNORMAL LOW (ref 22–32)
Calcium: 9.4 mg/dL (ref 8.9–10.3)
Chloride: 104 mmol/L (ref 98–111)
Creatinine, Ser: 0.7 mg/dL (ref 0.44–1.00)
GFR, Estimated: >60 mL/min (ref >60)
Glucose, Bld: 98 mg/dL (ref 70–99)
Potassium: 3.7 mmol/L (ref 3.5–5.1)
Sodium: 134 mmol/L — ABNORMAL LOW (ref 135–145)
Total Bilirubin: 0.4 mg/dL (ref 0.3–1.2)
Total Protein: 6.6 g/dL (ref 6.5–8.1)

## 2021-03-05 LAB — PROTEIN / CREATININE RATIO, URINE
Creatinine, Urine: 36 mg/dL
Total Protein, Urine: <6 mg/dL

## 2021-03-05 LAB — RPR: RPR Ser Ql: NONREACTIVE

## 2021-03-05 MED ORDER — HYDROXYZINE HCL 50 MG PO TABS: 50.0000 mg | ORAL_TABLET | Freq: Four times a day (QID) | ORAL | Status: DC | PRN

## 2021-03-05 MED ORDER — TERBUTALINE SULFATE 1 MG/ML IJ SOLN: 0.2500 mg | Freq: Once | INTRAMUSCULAR | Status: DC | PRN

## 2021-03-05 MED ORDER — MISOPROSTOL 50MCG HALF TABLET
50.0000 ug | ORAL_TABLET | ORAL | Status: DC | PRN
  Administered 2021-03-05 (×2): 50 ug via ORAL
  Filled 2021-03-05 (×2): qty 1

## 2021-03-05 MED ORDER — OXYTOCIN-SODIUM CHLORIDE 30-0.9 UT/500ML-% IV SOLN
2.5000 [IU]/h | INTRAVENOUS | Status: DC
  Filled 2021-03-05: qty 500

## 2021-03-05 MED ORDER — OXYTOCIN BOLUS FROM INFUSION: 333.0000 mL | Freq: Once | INTRAVENOUS | Status: DC

## 2021-03-05 MED ORDER — MISOPROSTOL 25 MCG QUARTER TABLET: 25.0000 ug | ORAL_TABLET | ORAL | Status: DC | PRN

## 2021-03-05 MED ORDER — SOD CITRATE-CITRIC ACID 500-334 MG/5ML PO SOLN
30.0000 mL | ORAL | Status: DC | PRN
  Administered 2021-03-06: 30 mL via ORAL
  Filled 2021-03-05: qty 30

## 2021-03-05 MED ORDER — LIDOCAINE HCL (PF) 1 % IJ SOLN: 30.0000 mL | INTRAMUSCULAR | Status: DC | PRN

## 2021-03-05 MED ORDER — MISOPROSTOL 25 MCG QUARTER TABLET
25.0000 ug | ORAL_TABLET | ORAL | Status: DC | PRN
  Administered 2021-03-05: 25 ug via VAGINAL
  Filled 2021-03-05 (×2): qty 1

## 2021-03-05 MED ORDER — ACETAMINOPHEN 325 MG PO TABS: 650.0000 mg | ORAL_TABLET | ORAL | Status: DC | PRN

## 2021-03-05 MED ORDER — BUTORPHANOL TARTRATE 1 MG/ML IJ SOLN
1.0000 mg | Freq: Once | INTRAMUSCULAR | Status: AC
  Administered 2021-03-05: 1 mg via INTRAVENOUS
  Filled 2021-03-05: qty 1

## 2021-03-05 MED ORDER — OXYCODONE-ACETAMINOPHEN 5-325 MG PO TABS: 1.0000 | ORAL_TABLET | ORAL | Status: DC | PRN

## 2021-03-05 MED ORDER — ONDANSETRON HCL 4 MG/2ML IJ SOLN: 4.0000 mg | Freq: Four times a day (QID) | INTRAMUSCULAR | Status: DC | PRN

## 2021-03-05 MED ORDER — LACTATED RINGERS IV SOLN: INTRAVENOUS | Status: DC

## 2021-03-05 MED ORDER — LACTATED RINGERS IV SOLN: 500.0000 mL | INTRAVENOUS | Status: DC | PRN

## 2021-03-05 MED ORDER — OXYTOCIN-SODIUM CHLORIDE 30-0.9 UT/500ML-% IV SOLN
1.0000 m[IU]/min | INTRAVENOUS | Status: DC
Start: 2021-03-06 — End: 2021-03-06
  Administered 2021-03-05: 2 m[IU]/min via INTRAVENOUS

## 2021-03-05 MED ORDER — FENTANYL CITRATE (PF) 100 MCG/2ML IJ SOLN
50.0000 ug | INTRAMUSCULAR | Status: DC | PRN
  Administered 2021-03-05 – 2021-03-06 (×2): 100 ug via INTRAVENOUS
  Filled 2021-03-05 (×2): qty 2

## 2021-03-05 MED ORDER — LABETALOL HCL 200 MG PO TABS
400.0000 mg | ORAL_TABLET | Freq: Two times a day (BID) | ORAL | Status: DC
  Administered 2021-03-05: 400 mg via ORAL
  Filled 2021-03-05 (×2): qty 2

## 2021-03-05 MED ORDER — OXYTOCIN-SODIUM CHLORIDE 30-0.9 UT/500ML-% IV SOLN: 1.0000 m[IU]/min | INTRAVENOUS | Status: DC

## 2021-03-05 NOTE — Progress Notes (Signed)
Urine to lab

## 2021-03-05 NOTE — Progress Notes (Addendum)
Labor Progress Note Teresa Roman is a 30 y.o. G1P0000 at [redacted]w[redacted]d presented for IOL for CHTN   O:  BP 129/87   Pulse 88   Temp 98.8 F (37.1 C) (Oral)   Resp 16   Ht 5\' 6"  (1.676 m)   Wt 126.6 kg   LMP  (LMP Unknown)   SpO2 99%   BMI 45.06 kg/m  EFM: baseline 150 bpm/ mod variability/ + accels/ no decels  Toco/IUPC: irregular  SVE: FT/50 per RN  A/P: 30 y.o. G1P0000 [redacted]w[redacted]d  1. Labor: latent 2. FWB: Cat I 3. CHTN: stable, labs normal  Continue Cytotec. FB when able. Anticipate SVD.  Julianne Handler, CNM 5:23 PM

## 2021-03-05 NOTE — Progress Notes (Signed)
Labor Progress Note Teresa Roman is a 30 y.o. G1P0000 at [redacted]w[redacted]d presented for IOL for CHTN  S:  Comfortable, not feeling any cramping or ctx.   O:  BP 129/87   Pulse 88   Temp 98.8 F (37.1 C) (Oral)   Resp 16   Ht 5\' 6"  (1.676 m)   Wt 126.6 kg   LMP  (LMP Unknown)   SpO2 99%   BMI 45.06 kg/m  EFM: baseline 155 bpm/ mod variability/ + accels/ no decels  Toco/IUPC: rare SVE: 1/50/-2   A/P: 30 y.o. G1P0000 [redacted]w[redacted]d  1. Labor: latent 2. FWB: Cat I 3. Pain: analgesia/anesthesia/NO prn 4. CHTN: stable  Consented for FB placement, placed w/o difficulty, tolerated well. Continue Cytotec, change to po. Pitocin when appropriate. Anticipate labor progress and SVD.  Julianne Handler, CNM 6:11 PM

## 2021-03-05 NOTE — Progress Notes (Addendum)
Labor Progress Note Teresa Roman is a 30 y.o. G1P0000 at [redacted]w[redacted]d presented for IOL for CHTN  S: Pt reporting increasing discomfort with contractions. FB still in place. No headache, vision changes or other concerns at this time.  O:  BP 132/84   Pulse 82   Temp 98.8 F (37.1 C) (Oral)   Resp 16   Ht 5\' 6"  (1.676 m)   Wt 126.6 kg   LMP  (LMP Unknown)   SpO2 99%   BMI 45.06 kg/m  EFM: baseline 140 bpm/mod variability/ + accels/ no decels  Toco: rare contractions SVE: 4 (nurse check around FB)   A/P: 30 y.o. G1P0000 [redacted]w[redacted]d presenting for IOL secondary to cHTN. Labor: FB still in placed but nearly out s/p cytotec x3. Will start pitocin at this time. FWB: Cat I strip Pain: IV fentanyl CHTN: Blood pressure in normal to mild range. Preeclampsia labs unremarkable on admission except for Hgb 10.3. Continued on po labetalol 400 mg BID. Pt asymptomatic. Will continue to monitor.  Randa Ngo, MD 11:36 PM

## 2021-03-05 NOTE — H&P (Addendum)
OBSTETRIC ADMISSION HISTORY AND PHYSICAL  Teresa Roman is a 30 y.o. female G1P0000 with IUP at [redacted]w[redacted]d by 17w [place] presenting for IOL secondary to [facility]. She reports +FMs, No LOF, no VB, no blurry vision, headaches or peripheral edema, and RUQ pain. Has not felt any contractions. She plans on bottle feeding. She request POPs for birth control. She received her prenatal care at [facility]  Dating: By [place] at [redacted]w[redacted]d --->  Estimated Date of Delivery: 03/12/21  Sono:    @[redacted]w[redacted]d , CWD, normal anatomy, cephalic presentation, posterior lie, 2494g, 49% EFW 5'8  Prenatal History/Complications:  cHTN - on labetalol 400 mg BID and bASA Obesity Uterine fibroids Single umbilical artery Anemia - has had 3 doses of Venofer  Past Medical History: Past Medical History:  Diagnosis Date   Hypertension     Past Surgical History: Past Surgical History:  Procedure Laterality Date   NO PAST SURGERIES      Obstetrical History: OB History     Gravida  1   Para  0   Term  0   Preterm  0   AB  0   Living  0      SAB  0   IAB  0   Ectopic  0   Multiple  0   Live Births  0           Social History Social History   Socioeconomic History   Marital status: Single    Spouse name: Not on file   Number of children: Not on file   Years of education: Not on file   Highest education level: Not on file  Occupational History   Not on file  Tobacco Use   Smoking status: Former    Pack years: 0.00    Types: Cigarettes    Quit date: 08/11/2020    Years since quitting: 0.5   Smokeless tobacco: Never  Vaping Use   Vaping Use: Never used  Substance and Sexual Activity   Alcohol use: Not Currently   Drug use: Not Currently    Types: Marijuana   Sexual activity: Yes  Other Topics Concern   Not on file  Social History Narrative   Not on file   Social Determinants of Health   Financial Resource Strain: Not on file  Food Insecurity: Food Insecurity Present   Worried About [place] in the Last Year: Sometimes true   Ran Out of Food in the Last Year: Sometimes true  Transportation Needs: No Transportation Needs   Lack of Transportation (Medical): No   Lack of Transportation (Non-Medical): No  Physical Activity: Not on file  Stress: Not on file  Social Connections: Not on file    Family History: Family History  Problem Relation Age of Onset   Arthritis Mother    Hypertension Mother    Cancer Father     Allergies: No Known Allergies  Medications Prior to Admission  Medication Sig Dispense Refill Last Dose   acetaminophen (TYLENOL) 500 MG tablet Take 500 mg by mouth every 6 (six) hours as needed.      aspirin 81 MG chewable tablet Chew 1 tablet (81 mg total) by mouth daily.      labetalol (NORMODYNE) 200 MG tablet Take 2 tablets (400 mg total) by mouth 2 (two) times daily. 60 tablet 3    Prenatal MV & Min w/FA-DHA (PRENATAL GUMMIES PO) Take 2 tablets by mouth daily.        Review of  Systems   All systems reviewed and negative except as stated in HPI  Blood pressure 122/88, pulse 90, temperature 99.1 F (37.3 C), temperature source Oral, resp. rate 16, height 5\' 6"  (1.676 m), weight 126.6 kg, SpO2 99 %. General appearance: alert, cooperative, appears stated age, and no distress Lungs: clear to auscultation bilaterally Heart: non-labored respirations Abdomen: gravid Presentation: cephalic confirmed on BSUS Fetal monitoringBaseline: 155 bpm, Variability: Good {> 6 bpm), Accelerations: Reactive, and Decelerations: Absent Uterine activityNone Dilation: Fingertip Effacement (%): Thick Station: -3 Exam by:: Colman Cater CNM Vtx by BSUS  Prenatal labs: ABO, Rh: --/--/A POS (06/25 0805) Antibody: NEG (06/25 0805) Rubella: 10.80 (02/04 1003) RPR: Non Reactive (05/05 0855)  HBsAg: Negative (02/04 1003)  HIV: Non Reactive (05/05 0855)  GBS: Negative/-- (06/09 1613)  2 hr Glucola wnl Genetic screening  NIPS low risk, Horizon negative Anatomy US  single umbilical artery, otherwise nl  Prenatal Transfer Tool  Maternal Diabetes: No Genetic Screening: Normal Maternal Ultrasounds/Referrals: Other:single umbilical artery, otherwise nl Fetal Ultrasounds or other Referrals:  None Maternal Substance Abuse:  No Significant Maternal Medications:  None Significant Maternal Lab Results: Group B Strep negative  Results for orders placed or performed during the hospital encounter of 03/05/21 (from the past 24 hour(s))  CBC   Collection Time: 03/05/21  7:40 AM  Result Value Ref Range   WBC 7.5 4.0 - 10.5 K/uL   RBC 3.36 (L) 3.87 - 5.11 MIL/uL   Hemoglobin 10.3 (L) 12.0 - 15.0 g/dL   HCT 31.5 (L) 36.0 - 46.0 %   MCV 93.8 80.0 - 100.0 fL   MCH 30.7 26.0 - 34.0 pg   MCHC 32.7 30.0 - 36.0 g/dL   RDW 12.7 11.5 - 15.5 %   Platelets 211 150 - 400 K/uL   nRBC 0.0 0.0 - 0.2 %  Comprehensive metabolic panel   Collection Time: 03/05/21  7:40 AM  Result Value Ref Range   Sodium 134 (L) 135 - 145 mmol/L   Potassium 3.7 3.5 - 5.1 mmol/L   Chloride 104 98 - 111 mmol/L   CO2 21 (L) 22 - 32 mmol/L   Glucose, Bld 98 70 - 99 mg/dL   BUN 9 6 - 20 mg/dL   Creatinine, Ser 0.70 0.44 - 1.00 mg/dL   Calcium 9.4 8.9 - 10.3 mg/dL   Total Protein 6.6 6.5 - 8.1 g/dL   Albumin 2.9 (L) 3.5 - 5.0 g/dL   AST 15 15 - 41 U/L   ALT 11 0 - 44 U/L   Alkaline Phosphatase 71 38 - 126 U/L   Total Bilirubin 0.4 0.3 - 1.2 mg/dL   GFR, Estimated >60 >60 mL/min   Anion gap 9 5 - 15  Type and screen   Collection Time: 03/05/21  8:05 AM  Result Value Ref Range   ABO/RH(D) A POS    Antibody Screen NEG    Sample Expiration      03/08/2021,2359 Performed at [facility], [address] [place], [place] [address]     Patient Active Problem List   Diagnosis Date Noted   Chronic hypertension in obstetric context in third trimester 03/05/2021   BMI 40.0-44.9, adult ([place]) 02/17/2021   Anemia of pregnancy 02/14/2021   Late prenatal care 01/13/2021   Morbid  obesity ([place]) 58/52/7782   Umbilical cord, single artery and vein 11/12/2020   Supervision of high risk pregnancy, antepartum 10/15/2020   Chronic hypertension affecting pregnancy 10/15/2020    Assessment/Plan:  Teresa Mackie  Roman is a 30 y.o. G1P0000 at [redacted]w[redacted]d here for IOL-cHTN  #IOL:   Latent, cervix unfavorable so will start Cytotec (0905). Plan for Pitocin/AROM prn when cervix favorable. Anticipate NSVD. #cHTN: normotensive, continue home labetalol 400 mg BID. PEC labs pending. #Pain: Per patient request, epidural desired #FWB: Cat I #ID:  GBS negative #MOF: bottle #MOC: POPs #Circ:  N/A #Anemia: Hgb 10.3 on admission  Teresa Button, MD  03/05/2021, 9:49 AM  Midwife attestation: I have seen and examined this patient; I agree with above documentation in the resident's note.   PE: Gen: calm comfortable, NAD Resp: normal effort and rate Abd: gravid  ROS, labs, PMH reviewed  Assessment/Plan: [redacted] weeks gestation Labor: latent FWB: Cat I GBS: neg Admit to LD Cervical ripening Anticipate SVD Continue po Labetalol  Julianne Handler, CNM  03/05/2021, 10:06 AM

## 2021-03-06 ENCOUNTER — Inpatient Hospital Stay (HOSPITAL_COMMUNITY): Payer: Medicaid Other | Admitting: Anesthesiology

## 2021-03-06 ENCOUNTER — Encounter (HOSPITAL_COMMUNITY)
Admission: AD | Disposition: A | Discharge status: Home / Self Care | Payer: Self-pay | Source: Home / Self Care | Attending: Obstetrics & Gynecology

## 2021-03-06 ENCOUNTER — Encounter (HOSPITAL_COMMUNITY): Payer: Self-pay | Admitting: Obstetrics and Gynecology

## 2021-03-06 DIAGNOSIS — O99214 Obesity complicating childbirth: Secondary | ICD-10-CM | POA: Diagnosis not present

## 2021-03-06 DIAGNOSIS — O9081 Anemia of the puerperium: Secondary | ICD-10-CM | POA: Diagnosis not present

## 2021-03-06 DIAGNOSIS — Z7982 Long term (current) use of aspirin: Secondary | ICD-10-CM | POA: Diagnosis not present

## 2021-03-06 DIAGNOSIS — Z87891 Personal history of nicotine dependence: Secondary | ICD-10-CM | POA: Diagnosis not present

## 2021-03-06 DIAGNOSIS — Z3A39 39 weeks gestation of pregnancy: Secondary | ICD-10-CM | POA: Diagnosis not present

## 2021-03-06 DIAGNOSIS — D259 Leiomyoma of uterus, unspecified: Secondary | ICD-10-CM | POA: Diagnosis not present

## 2021-03-06 DIAGNOSIS — O1092 Unspecified pre-existing hypertension complicating childbirth: Secondary | ICD-10-CM | POA: Diagnosis not present

## 2021-03-06 DIAGNOSIS — O134 Gestational [pregnancy-induced] hypertension without significant proteinuria, complicating childbirth: Secondary | ICD-10-CM | POA: Diagnosis not present

## 2021-03-06 DIAGNOSIS — D62 Acute posthemorrhagic anemia: Secondary | ICD-10-CM | POA: Diagnosis not present

## 2021-03-06 DIAGNOSIS — O3413 Maternal care for benign tumor of corpus uteri, third trimester: Secondary | ICD-10-CM | POA: Diagnosis not present

## 2021-03-06 DIAGNOSIS — O1002 Pre-existing essential hypertension complicating childbirth: Secondary | ICD-10-CM | POA: Diagnosis not present

## 2021-03-06 LAB — CBC
HCT: 31.2 % — ABNORMAL LOW (ref 36.0–46.0)
Hemoglobin: 10.4 g/dL — ABNORMAL LOW (ref 12.0–15.0)
MCH: 30.7 pg (ref 26.0–34.0)
MCHC: 33.3 g/dL (ref 30.0–36.0)
MCV: 92 fL (ref 80.0–100.0)
Platelets: 199 10*3/uL (ref 150–400)
RBC: 3.39 MIL/uL — ABNORMAL LOW (ref 3.87–5.11)
RDW: 12.7 % (ref 11.5–15.5)
WBC: 8.5 10*3/uL (ref 4.0–10.5)
nRBC: 0 % (ref 0.0–0.2)

## 2021-03-06 SURGERY — Surgical Case
Anesthesia: Epidural

## 2021-03-06 MED ORDER — LIDOCAINE-EPINEPHRINE (PF) 2 %-1:200000 IJ SOLN
INTRAMUSCULAR | Status: DC | PRN
  Administered 2021-03-06: 4 mL via EPIDURAL
  Administered 2021-03-06 (×2): 5 mL via EPIDURAL
  Administered 2021-03-06: 3 mL via EPIDURAL

## 2021-03-06 MED ORDER — MEASLES, MUMPS & RUBELLA VAC IJ SOLR: 0.5000 mL | Freq: Once | INTRAMUSCULAR | Status: DC

## 2021-03-06 MED ORDER — OXYCODONE HCL 5 MG PO TABS: 5.0000 mg | ORAL_TABLET | ORAL | Status: DC | PRN

## 2021-03-06 MED ORDER — SODIUM BICARBONATE 8.4 % IV SOLN
INTRAVENOUS | Status: AC
  Filled 2021-03-06: qty 50

## 2021-03-06 MED ORDER — DEXAMETHASONE SODIUM PHOSPHATE 10 MG/ML IJ SOLN
INTRAMUSCULAR | Status: DC | PRN
  Administered 2021-03-06: 10 mg via INTRAVENOUS

## 2021-03-06 MED ORDER — NALOXONE HCL 0.4 MG/ML IJ SOLN: 0.4000 mg | INTRAMUSCULAR | Status: DC | PRN

## 2021-03-06 MED ORDER — COCONUT OIL OIL: 1.0000 "application " | TOPICAL_OIL | Status: DC | PRN

## 2021-03-06 MED ORDER — NALBUPHINE HCL 10 MG/ML IJ SOLN: 5.0000 mg | Freq: Once | INTRAMUSCULAR | Status: DC | PRN

## 2021-03-06 MED ORDER — ZOLPIDEM TARTRATE 5 MG PO TABS: 5.0000 mg | ORAL_TABLET | Freq: Every evening | ORAL | Status: DC | PRN

## 2021-03-06 MED ORDER — FENTANYL-BUPIVACAINE-NACL 0.5-0.125-0.9 MG/250ML-% EP SOLN
12.0000 mL/h | EPIDURAL | Status: DC | PRN
Start: 2021-03-06 — End: 2021-03-06
  Administered 2021-03-06: 12 mL/h via EPIDURAL
  Filled 2021-03-06: qty 250

## 2021-03-06 MED ORDER — MORPHINE SULFATE (PF) 0.5 MG/ML IJ SOLN
INTRAMUSCULAR | Status: AC
  Filled 2021-03-06: qty 10

## 2021-03-06 MED ORDER — DIPHENHYDRAMINE HCL 25 MG PO CAPS
25.0000 mg | ORAL_CAPSULE | Freq: Four times a day (QID) | ORAL | Status: DC | PRN
  Administered 2021-03-06 – 2021-03-07 (×2): 25 mg via ORAL
  Filled 2021-03-06 (×2): qty 1

## 2021-03-06 MED ORDER — OXYCODONE HCL 5 MG/5ML PO SOLN: 5.0000 mg | Freq: Once | ORAL | Status: DC | PRN

## 2021-03-06 MED ORDER — TETANUS-DIPHTH-ACELL PERTUSSIS 5-2.5-18.5 LF-MCG/0.5 IM SUSY: 0.5000 mL | PREFILLED_SYRINGE | Freq: Once | INTRAMUSCULAR | Status: DC

## 2021-03-06 MED ORDER — DIPHENHYDRAMINE HCL 50 MG/ML IJ SOLN: 12.5000 mg | INTRAMUSCULAR | Status: DC | PRN

## 2021-03-06 MED ORDER — KETOROLAC TROMETHAMINE 30 MG/ML IJ SOLN
30.0000 mg | Freq: Four times a day (QID) | INTRAMUSCULAR | Status: DC | PRN
  Administered 2021-03-06: 30 mg via INTRAMUSCULAR

## 2021-03-06 MED ORDER — SODIUM CHLORIDE 0.9% FLUSH: 3.0000 mL | INTRAVENOUS | Status: DC | PRN

## 2021-03-06 MED ORDER — ONDANSETRON HCL 4 MG/2ML IJ SOLN: 4.0000 mg | Freq: Three times a day (TID) | INTRAMUSCULAR | Status: DC | PRN

## 2021-03-06 MED ORDER — NIFEDIPINE ER OSMOTIC RELEASE 30 MG PO TB24
30.0000 mg | ORAL_TABLET | Freq: Every day | ORAL | Status: DC
  Administered 2021-03-06 – 2021-03-07 (×2): 30 mg via ORAL
  Filled 2021-03-06 (×2): qty 1

## 2021-03-06 MED ORDER — DEXTROSE 5 % IV SOLN
INTRAVENOUS | Status: DC | PRN
  Administered 2021-03-06: 3 g via INTRAVENOUS

## 2021-03-06 MED ORDER — MORPHINE SULFATE (PF) 0.5 MG/ML IJ SOLN
INTRAMUSCULAR | Status: DC | PRN
  Administered 2021-03-06: 2 mg via INTRAVENOUS

## 2021-03-06 MED ORDER — SODIUM BICARBONATE 8.4 % IV SOLN: INTRAVENOUS | Status: DC | PRN

## 2021-03-06 MED ORDER — SENNOSIDES-DOCUSATE SODIUM 8.6-50 MG PO TABS
2.0000 | ORAL_TABLET | Freq: Every day | ORAL | Status: DC
  Administered 2021-03-07 – 2021-03-08 (×2): 2 via ORAL
  Filled 2021-03-06 (×2): qty 2

## 2021-03-06 MED ORDER — ONDANSETRON HCL 4 MG/2ML IJ SOLN
INTRAMUSCULAR | Status: DC | PRN
  Administered 2021-03-06: 4 mg via INTRAVENOUS

## 2021-03-06 MED ORDER — DIBUCAINE (PERIANAL) 1 % EX OINT: 1.0000 "application " | TOPICAL_OINTMENT | CUTANEOUS | Status: DC | PRN

## 2021-03-06 MED ORDER — OXYTOCIN-SODIUM CHLORIDE 30-0.9 UT/500ML-% IV SOLN
2.5000 [IU]/h | INTRAVENOUS | Status: AC
  Administered 2021-03-06: 2.5 [IU]/h via INTRAVENOUS
  Filled 2021-03-06: qty 500

## 2021-03-06 MED ORDER — ONDANSETRON HCL 4 MG/2ML IJ SOLN: 4.0000 mg | Freq: Four times a day (QID) | INTRAMUSCULAR | Status: DC | PRN

## 2021-03-06 MED ORDER — SODIUM CHLORIDE 0.9 % IV SOLN
INTRAVENOUS | Status: AC
  Filled 2021-03-06: qty 500

## 2021-03-06 MED ORDER — TRANEXAMIC ACID-NACL 1000-0.7 MG/100ML-% IV SOLN
1000.0000 mg | INTRAVENOUS | Status: AC
  Administered 2021-03-06: 1000 mg via INTRAVENOUS

## 2021-03-06 MED ORDER — EPHEDRINE 5 MG/ML INJ: 10.0000 mg | INTRAVENOUS | Status: DC | PRN

## 2021-03-06 MED ORDER — TRANEXAMIC ACID-NACL 1000-0.7 MG/100ML-% IV SOLN
INTRAVENOUS | Status: AC
  Filled 2021-03-06: qty 100

## 2021-03-06 MED ORDER — LIDOCAINE HCL (PF) 1 % IJ SOLN
INTRAMUSCULAR | Status: DC | PRN
  Administered 2021-03-06 (×2): 5 mL via EPIDURAL

## 2021-03-06 MED ORDER — FENTANYL CITRATE (PF) 100 MCG/2ML IJ SOLN: 25.0000 ug | INTRAMUSCULAR | Status: DC | PRN

## 2021-03-06 MED ORDER — IBUPROFEN 600 MG PO TABS
600.0000 mg | ORAL_TABLET | Freq: Four times a day (QID) | ORAL | Status: DC
  Administered 2021-03-07 – 2021-03-08 (×3): 600 mg via ORAL
  Filled 2021-03-06 (×3): qty 1

## 2021-03-06 MED ORDER — LACTATED RINGERS AMNIOINFUSION: INTRAVENOUS | Status: DC

## 2021-03-06 MED ORDER — PHENYLEPHRINE 40 MCG/ML (10ML) SYRINGE FOR IV PUSH (FOR BLOOD PRESSURE SUPPORT): 80.0000 ug | PREFILLED_SYRINGE | INTRAVENOUS | Status: DC | PRN

## 2021-03-06 MED ORDER — MORPHINE SULFATE (PF) 0.5 MG/ML IJ SOLN
INTRAMUSCULAR | Status: DC | PRN
  Administered 2021-03-06: 3 mg via EPIDURAL

## 2021-03-06 MED ORDER — KETOROLAC TROMETHAMINE 30 MG/ML IJ SOLN
30.0000 mg | Freq: Four times a day (QID) | INTRAMUSCULAR | Status: AC
  Administered 2021-03-06 – 2021-03-07 (×4): 30 mg via INTRAVENOUS
  Filled 2021-03-06 (×4): qty 1

## 2021-03-06 MED ORDER — OXYCODONE-ACETAMINOPHEN 5-325 MG PO TABS: 2.0000 | ORAL_TABLET | ORAL | Status: DC | PRN

## 2021-03-06 MED ORDER — ACETAMINOPHEN 500 MG PO TABS
1000.0000 mg | ORAL_TABLET | Freq: Four times a day (QID) | ORAL | Status: DC
  Administered 2021-03-06 – 2021-03-08 (×7): 1000 mg via ORAL
  Filled 2021-03-06 (×7): qty 2

## 2021-03-06 MED ORDER — SODIUM CHLORIDE 0.9 % IV SOLN
500.0000 mg | INTRAVENOUS | Status: AC
  Administered 2021-03-06: 500 mg via INTRAVENOUS

## 2021-03-06 MED ORDER — PRENATAL MULTIVITAMIN CH
1.0000 | ORAL_TABLET | Freq: Every day | ORAL | Status: DC
  Administered 2021-03-07: 1 via ORAL
  Filled 2021-03-06: qty 1

## 2021-03-06 MED ORDER — SIMETHICONE 80 MG PO CHEW: 80.0000 mg | CHEWABLE_TABLET | ORAL | Status: DC | PRN

## 2021-03-06 MED ORDER — OXYTOCIN-SODIUM CHLORIDE 30-0.9 UT/500ML-% IV SOLN
INTRAVENOUS | Status: AC
  Filled 2021-03-06: qty 500

## 2021-03-06 MED ORDER — OXYCODONE HCL 5 MG PO TABS: 5.0000 mg | ORAL_TABLET | Freq: Once | ORAL | Status: DC | PRN

## 2021-03-06 MED ORDER — MAGNESIUM HYDROXIDE 400 MG/5ML PO SUSP: 30.0000 mL | ORAL | Status: DC | PRN

## 2021-03-06 MED ORDER — SODIUM CHLORIDE 0.9 % IV SOLN
2.0000 g | INTRAVENOUS | Status: DC
  Filled 2021-03-06: qty 2

## 2021-03-06 MED ORDER — MENTHOL 3 MG MT LOZG: 1.0000 | LOZENGE | OROMUCOSAL | Status: DC | PRN

## 2021-03-06 MED ORDER — LACTATED RINGERS IV SOLN: INTRAVENOUS | Status: DC

## 2021-03-06 MED ORDER — DIPHENHYDRAMINE HCL 25 MG PO CAPS: 25.0000 mg | ORAL_CAPSULE | ORAL | Status: DC | PRN

## 2021-03-06 MED ORDER — SODIUM CHLORIDE 0.9 % IV SOLN
500.0000 mg | Freq: Once | INTRAVENOUS | Status: AC
  Administered 2021-03-07: 500 mg via INTRAVENOUS
  Filled 2021-03-06 (×2): qty 25

## 2021-03-06 MED ORDER — GABAPENTIN 100 MG PO CAPS
300.0000 mg | ORAL_CAPSULE | Freq: Two times a day (BID) | ORAL | Status: DC
  Administered 2021-03-06 – 2021-03-08 (×4): 300 mg via ORAL
  Filled 2021-03-06 (×4): qty 3

## 2021-03-06 MED ORDER — NALOXONE HCL 4 MG/10ML IJ SOLN
1.0000 ug/kg/h | INTRAVENOUS | Status: DC | PRN
  Filled 2021-03-06: qty 5

## 2021-03-06 MED ORDER — MEPERIDINE HCL 25 MG/ML IJ SOLN
6.2500 mg | INTRAMUSCULAR | Status: DC | PRN
Start: 2021-03-06 — End: 2021-03-06

## 2021-03-06 MED ORDER — SCOPOLAMINE 1 MG/3DAYS TD PT72: 1.0000 | MEDICATED_PATCH | Freq: Once | TRANSDERMAL | Status: DC

## 2021-03-06 MED ORDER — KETOROLAC TROMETHAMINE 30 MG/ML IJ SOLN
INTRAMUSCULAR | Status: AC
  Filled 2021-03-06: qty 1

## 2021-03-06 MED ORDER — HYDROMORPHONE HCL 1 MG/ML IJ SOLN: 1.0000 mg | INTRAMUSCULAR | Status: DC | PRN

## 2021-03-06 MED ORDER — ONDANSETRON HCL 4 MG/2ML IJ SOLN
INTRAMUSCULAR | Status: AC
  Filled 2021-03-06: qty 2

## 2021-03-06 MED ORDER — ENOXAPARIN SODIUM 60 MG/0.6ML IJ SOSY
60.0000 mg | PREFILLED_SYRINGE | INTRAMUSCULAR | Status: DC
  Administered 2021-03-07 – 2021-03-08 (×2): 60 mg via SUBCUTANEOUS
  Filled 2021-03-06 (×2): qty 0.6

## 2021-03-06 MED ORDER — DEXAMETHASONE SODIUM PHOSPHATE 10 MG/ML IJ SOLN
INTRAMUSCULAR | Status: AC
  Filled 2021-03-06: qty 1

## 2021-03-06 MED ORDER — TRAMADOL HCL 50 MG PO TABS: 50.0000 mg | ORAL_TABLET | Freq: Four times a day (QID) | ORAL | Status: DC | PRN

## 2021-03-06 MED ORDER — LACTATED RINGERS IV SOLN: 500.0000 mL | Freq: Once | INTRAVENOUS | Status: DC

## 2021-03-06 MED ORDER — CEFAZOLIN IN SODIUM CHLORIDE 3-0.9 GM/100ML-% IV SOLN
INTRAVENOUS | Status: AC
  Filled 2021-03-06: qty 100

## 2021-03-06 MED ORDER — KETOROLAC TROMETHAMINE 30 MG/ML IJ SOLN: 30.0000 mg | Freq: Four times a day (QID) | INTRAMUSCULAR | Status: DC | PRN

## 2021-03-06 MED ORDER — WITCH HAZEL-GLYCERIN EX PADS: 1.0000 "application " | MEDICATED_PAD | CUTANEOUS | Status: DC | PRN

## 2021-03-06 MED ORDER — FERROUS SULFATE 325 (65 FE) MG PO TABS
325.0000 mg | ORAL_TABLET | ORAL | Status: DC
  Filled 2021-03-06: qty 1

## 2021-03-06 MED ORDER — OXYTOCIN-SODIUM CHLORIDE 30-0.9 UT/500ML-% IV SOLN
INTRAVENOUS | Status: DC | PRN
  Administered 2021-03-06: 300 mL via INTRAVENOUS

## 2021-03-06 MED ORDER — NALBUPHINE HCL 10 MG/ML IJ SOLN: 5.0000 mg | INTRAMUSCULAR | Status: DC | PRN

## 2021-03-06 SURGICAL SUPPLY — 33 items
CHLORAPREP W/TINT 26ML (MISCELLANEOUS) ×3 IMPLANT
CLAMP CORD UMBIL (MISCELLANEOUS) IMPLANT
CLOTH BEACON ORANGE TIMEOUT ST (SAFETY) ×3 IMPLANT
DRESSING PREVENA PLUS CUSTOM (GAUZE/BANDAGES/DRESSINGS) ×1 IMPLANT
DRSG OPSITE POSTOP 4X10 (GAUZE/BANDAGES/DRESSINGS) ×3 IMPLANT
DRSG PREVENA PLUS CUSTOM (GAUZE/BANDAGES/DRESSINGS) ×3
ELECT REM PT RETURN 9FT ADLT (ELECTROSURGICAL) ×3
ELECTRODE REM PT RTRN 9FT ADLT (ELECTROSURGICAL) ×1 IMPLANT
EXTRACTOR VACUUM M CUP 4 TUBE (SUCTIONS) IMPLANT
EXTRACTOR VACUUM M CUP 4' TUBE (SUCTIONS)
GLOVE BIOGEL PI IND STRL 7.0 (GLOVE) ×3 IMPLANT
GLOVE BIOGEL PI INDICATOR 7.0 (GLOVE) ×6
GLOVE ECLIPSE 7.0 STRL STRAW (GLOVE) ×3 IMPLANT
GOWN STRL REUS W/TWL LRG LVL3 (GOWN DISPOSABLE) ×6 IMPLANT
KIT ABG SYR 3ML LUER SLIP (SYRINGE) IMPLANT
NEEDLE HYPO 22GX1.5 SAFETY (NEEDLE) ×3 IMPLANT
NEEDLE HYPO 25X5/8 SAFETYGLIDE (NEEDLE) ×3 IMPLANT
NS IRRIG 1000ML POUR BTL (IV SOLUTION) ×3 IMPLANT
PACK C SECTION WH (CUSTOM PROCEDURE TRAY) ×3 IMPLANT
PAD ABD 7.5X8 STRL (GAUZE/BANDAGES/DRESSINGS) ×3 IMPLANT
PAD OB MATERNITY 4.3X12.25 (PERSONAL CARE ITEMS) ×3 IMPLANT
PENCIL SMOKE EVAC W/HOLSTER (ELECTROSURGICAL) ×3 IMPLANT
RTRCTR C-SECT PINK 25CM LRG (MISCELLANEOUS) IMPLANT
SPONGE LAP 18X18 X RAY DECT (DISPOSABLE) ×3 IMPLANT
SUT PDS AB 0 CTX 36 PDP370T (SUTURE) IMPLANT
SUT PLAIN 2 0 XLH (SUTURE) IMPLANT
SUT VIC AB 0 CTX 36 (SUTURE) ×4
SUT VIC AB 0 CTX36XBRD ANBCTRL (SUTURE) ×2 IMPLANT
SUT VIC AB 4-0 KS 27 (SUTURE) ×3 IMPLANT
SYR CONTROL 10ML LL (SYRINGE) ×3 IMPLANT
TOWEL OR 17X24 6PK STRL BLUE (TOWEL DISPOSABLE) ×3 IMPLANT
TRAY FOLEY W/BAG SLVR 14FR LF (SET/KITS/TRAYS/PACK) ×3 IMPLANT
WATER STERILE IRR 1000ML POUR (IV SOLUTION) ×3 IMPLANT

## 2021-03-06 NOTE — Op Note (Signed)
Teresa Roman PROCEDURE DATE: 03/06/2021  PREOPERATIVE DIAGNOSES: Intrauterine pregnancy at [redacted]w[redacted]d weeks gestation; non-reassuring fetal status and failed induction of labor for chronic hypertension  POSTOPERATIVE DIAGNOSES: The same  PROCEDURE: Primary Low Transverse Cesarean Section  SURGEON:  Dr. Verita Schneiders  ANESTHESIOLOGY TEAM: Anesthesiologist: Murvin Natal, MD; Albertha Ghee, MD CRNA: Asher Muir, CRNA  INDICATIONS: Teresa Roman is a 30 y.o. G1P0000 at [redacted]w[redacted]d here for cesarean section secondary to the indications listed under preoperative diagnoses; please see preoperative note for further details.  The risks of surgery were discussed with the patient including but were not limited to: bleeding which may require transfusion or reoperation; infection which may require antibiotics; injury to bowel, bladder, ureters or other surrounding organs; injury to the fetus; need for additional procedures including hysterectomy in the event of a life-threatening hemorrhage; formation of adhesions; placental abnormalities wth subsequent pregnancies; incisional problems; thromboembolic phenomenon and other postoperative/anesthesia complications.  The patient concurred with the proposed plan, giving informed written consent for the procedure.    FINDINGS:  Viable female infant in cephalic, direct occiput presentation.  Three loops of the umbilical cord noted; nuchal, body and around lower extremity.  Apgars 8 and 9.   Clear amniotic fluid.  Intact placenta, three vessel cord.  Normal uterus, fallopian tubes and ovaries bilaterally. Prevena placed over incision at the end of the procedure.  ANESTHESIA: Epidural  ESTIMATED BLOOD LOSS: 850 ml SPECIMENS: Placenta sent to L&D COMPLICATIONS: None immediate  PROCEDURE IN DETAIL:  The patient preoperatively received intravenous antibiotics and had sequential compression devices applied to her lower extremities.  She was then taken to the operating  room the epidural anesthesia was dosed up to surgical level and was found to be adequate. She was then placed in a dorsal supine position with a leftward tilt, and prepped and draped in a sterile manner.  A foley catheter was placed into her bladder and attached to constant gravity.  After an adequate timeout was performed, a Pfannenstiel skin incision was made with scalpel and carried through to the underlying layer of fascia. The fascia was incised in the midline, and this incision was extended bilaterally using the Mayo scissors.  Kocher clamps were applied to the superior aspect of the fascial incision and the underlying rectus muscles were dissected off bluntly and sharply.  A similar process was carried out on the inferior aspect of the fascial incision. The rectus muscles were separated in the midline and the peritoneum was entered bluntly. The Alexis self-retaining retractor was introduced into the abdominal cavity.  Attention was turned to the lower uterine segment where a low transverse hysterotomy was made with a scalpel and extended bilaterally bluntly.  The infant was successfully delivered, the cord was clamped and cut after one minute, and the infant was handed over to the awaiting neonatology team. Uterine massage was then administered, and the placenta delivered intact with a three-vessel cord. The uterus was then cleared of clots and debris.  The hysterotomy was closed with 0 Vicryl in a running locked fashion, and an imbricating layer was also placed with 0 Vicryl.   The pelvis was cleared of all clot and debris. Hemostasis was confirmed on all surfaces.  The retractor was removed.  The peritoneum was closed with a 0 Vicryl running stitch. The fascia was then closed using 0 PDS in a running fashion.  The subcutaneous layer was irrigated, reapproximated with 2-0 plain gut interrupted stitches. There were a few subcutaneous bleeders, this was controlled using  electrocautery.  The skin was closed  with a 4-0 Vicryl subcuticular stitch. After the skin was closed, a Prevena disposable negative pressure wound therapy device was placed over the incision.  The suction was activated at a pressure of -125 mmHg.  The adhesive was affixed well and there were no leaks noted.  The patient tolerated the procedure well. Sponge, instrument and needle counts were correct x 3.  She was taken to the recovery room in stable condition.     Verita Schneiders, MD, [place] for [employer], [place]

## 2021-03-06 NOTE — Anesthesia Procedure Notes (Signed)
Epidural Patient location during procedure: OB Start time: 03/06/2021 3:23 AM End time: 03/06/2021 3:33 AM  Staffing Anesthesiologist: Murvin Natal, MD Performed: anesthesiologist   Preanesthetic Checklist Completed: patient identified, IV checked, site marked, risks and benefits discussed, monitors and equipment checked, pre-op evaluation and timeout performed  Epidural Patient position: sitting Prep: DuraPrep Patient monitoring: heart rate, cardiac monitor, continuous pulse ox and blood pressure Approach: midline Location: L4-L5 Injection technique: LOR air  Needle:  Needle type: Tuohy  Needle gauge: 17 G Needle length: 9 cm Needle insertion depth: 7 cm Catheter type: closed end flexible Catheter size: 19 Gauge Catheter at skin depth: 13 cm Test dose: negative and Other  Assessment Events: blood not aspirated, injection not painful, no injection resistance and negative IV test  Additional Notes Informed consent obtained prior to proceeding including risk of failure, 1% risk of PDPH, risk of minor discomfort and bruising. Discussed alternatives to epidural analgesia and patient desires to proceed.  Timeout performed pre-procedure verifying patient name, procedure, and platelet count.  Patient tolerated procedure well. Reason for block:procedure for pain

## 2021-03-06 NOTE — Discharge Summary (Signed)
Postpartum Discharge Summary    Patient Name: Teresa Roman DOB: 1991-02-20 MRN: [id number]  Date of admission: 03/05/2021 Delivery date:03/06/2021  Delivering provider: Verita Schneiders A  Date of discharge: 03/08/2021  Admitting diagnosis: Chronic hypertension in obstetric context in third trimester [O10.913] Intrauterine pregnancy: [redacted]w[redacted]d     Secondary diagnosis:  Principal Problem:   Cesarean delivery delivered Active Problems:   S/P cesarean section   Chronic hypertension affecting pregnancy   Morbid obesity ([place])   Anemia of pregnancy   BMI 40.0-44.9, adult ([place])  Additional problems: Anemia in pregnancy    Discharge diagnosis: Term Pregnancy Delivered, CHTN, and Anemia                                              Post partum procedures: none Augmentation: Pitocin, Cytotec, and IP Foley Complications: None  Hospital course: Induction of Labor With Cesarean Section   30 y.o. yo G1P0000 at [redacted]w[redacted]d was admitted to the hospital 03/05/2021 for induction of labor. Patient had a labor course significant for recurrent variable and late decelerations with arrest of dilation. The patient went for cesarean section due to Arrest of Dilation and Non-Reassuring FHR. Delivery details are as follows: Membrane Rupture Time/Date: 2:17 AM ,03/06/2021   Delivery Method:C-Section, Low Transverse  Details of operation can be found in separate operative Note.  Patient had an uncomplicated postpartum course. She is ambulating, tolerating a regular diet, passing flatus, and urinating well. Given persistent mild range blood pressures s/p delivery, pt was discharged on nifedipine $RemoveBefor'60mg'jbFXaxwLZugR$  daily with plan for 1 week blood pressure check in clinic. Patient is discharged home in stable condition on 03/08/21.      Newborn Data: Birth date:03/06/2021  Birth time:10:34 AM  Gender:Female  Living status:Living  Apgars:8 ,9  Weight:3305 g                                Magnesium Sulfate received: No BMZ received:  No Rhophylac:N/A MMR:N/A T-DaP:Given prenatally Flu: No Transfusion:Yes (IV iron)  Physical exam  Vitals:   03/07/21 1015 03/07/21 1610 03/07/21 2122 03/08/21 0528  BP: 105/88 131/83 124/70 140/88  Pulse: 93 86 85 91  Resp:  $Remo'18 19 20  'oAfOI$ Temp:  98 F (36.7 C) 97.8 F (36.6 C) 97.8 F (36.6 C)  TempSrc:  Oral Oral Oral  SpO2:   98% 100%  Weight:      Height:       General: alert, cooperative, and no distress Lochia: appropriate Uterine Fundus: firm Incision: Prevena wound vac in place with good suction DVT Evaluation: No evidence of DVT seen on physical exam. No cords or calf tenderness. No significant calf/ankle edema. Labs: Lab Results  Component Value Date   WBC 16.6 (H) 03/07/2021   HGB 8.5 (L) 03/07/2021   HCT 25.3 (L) 03/07/2021   MCV 94.1 03/07/2021   PLT 182 03/07/2021   CMP Latest Ref Rng & Units 03/07/2021  Glucose 70 - 99 mg/dL 129(H)  BUN 6 - 20 mg/dL 11  Creatinine 0.44 - 1.00 mg/dL 0.78  Sodium 135 - 145 mmol/L 132(L)  Potassium 3.5 - 5.1 mmol/L 4.0  Chloride 98 - 111 mmol/L 103  CO2 22 - 32 mmol/L 23  Calcium 8.9 - 10.3 mg/dL 8.7(L)  Total Protein 6.5 - 8.1 g/dL 5.4(L)  Total  Bilirubin 0.3 - 1.2 mg/dL 0.4  Alkaline Phos 38 - 126 U/L 62  AST 15 - 41 U/L 16  ALT 0 - 44 U/L 12   Edinburgh Score: Edinburgh Postnatal Depression Scale Screening Tool 03/07/2021  I have been able to laugh and see the funny side of things. 0  I have looked forward with enjoyment to things. 0  I have blamed myself unnecessarily when things went wrong. 0  I have been anxious or worried for no good reason. 0  I have felt scared or panicky for no good reason. 0  Things have been getting on top of me. 0  I have been so unhappy that I have had difficulty sleeping. 0  I have felt sad or miserable. 0  I have been so unhappy that I have been crying. 0  The thought of harming myself has occurred to me. 0  Edinburgh Postnatal Depression Scale Total 0     After visit meds:   Allergies as of 03/08/2021   No Known Allergies      Medication List     STOP taking these medications    aspirin 81 MG chewable tablet   labetalol 200 MG tablet Commonly known as: NORMODYNE       TAKE these medications    acetaminophen 500 MG tablet Commonly known as: TYLENOL Take 500 mg by mouth every 6 (six) hours as needed.   coconut oil Oil Apply 1 application topically as needed (nipple pain).   ferrous sulfate 325 (65 FE) MG tablet Take 1 tablet (325 mg total) by mouth every other day. Start taking on: March 09, 2021   ibuprofen 600 MG tablet Commonly known as: ADVIL Take 1 tablet (600 mg total) by mouth every 6 (six) hours.   NIFEdipine 60 MG 24 hr tablet Commonly known as: ADALAT CC Take 1 tablet (60 mg total) by mouth daily.   norethindrone 0.35 MG tablet Commonly known as: Ortho Micronor Take 1 tablet (0.35 mg total) by mouth daily.   oxyCODONE 5 MG immediate release tablet Commonly known as: Oxy IR/ROXICODONE Take 1-2 tablets (5-10 mg total) by mouth every 6 (six) hours as needed for severe pain or breakthrough pain.   PRENATAL GUMMIES PO Take 2 tablets by mouth daily.       Discharge home in stable condition Infant Feeding: Bottle Infant Disposition:home with mother Discharge instruction: per After Visit Summary and Postpartum booklet. Activity: Advance as tolerated. Pelvic rest for 6 weeks.  Diet: routine diet Future Appointments:No future appointments. Follow up Visit:  Please schedule this patient for a In person postpartum visit in 4 weeks with the following provider: Any provider. Additional Postpartum F/U:Incision check 1 week and BP check 2-3 days  High risk pregnancy complicated by: HTN Delivery mode:  C-Section, Low Transverse  Anticipated Birth Control:  POPs  Edris Friedt, Gildardo Cranker, MD OB Fellow, Faculty Practice 03/08/2021 7:14 AM

## 2021-03-06 NOTE — Anesthesia Postprocedure Evaluation (Signed)
Anesthesia Post Note  Patient: Teresa Roman  Procedure(s) Performed: [place]     Patient location during evaluation: Mother Baby Anesthesia Type: Epidural Level of consciousness: awake and alert, oriented and patient cooperative Pain management: pain level controlled Vital Signs Assessment: post-procedure vital signs reviewed and stable Respiratory status: spontaneous breathing Cardiovascular status: stable Postop Assessment: no headache, epidural receding, patient able to bend at knees and no signs of nausea or vomiting Anesthetic complications: no Comments: Pt. States pain score 0.    No notable events documented.  Last Vitals:  Vitals:   03/06/21 1445 03/06/21 1545  BP: (!) 142/75 132/84  Pulse: 84 79  Resp: 17 16  Temp:  37.2 C  SpO2: 97% 98%    Last Pain:  Vitals:   03/06/21 1545  TempSrc: Axillary  PainSc: 0-No pain   Pain Goal: Patients Stated Pain Goal: 3 (03/06/21 1342)                 Rico Sheehan

## 2021-03-06 NOTE — Anesthesia Preprocedure Evaluation (Signed)
Anesthesia Evaluation  Patient identified by MRN, date of birth, ID band Patient awake    Reviewed: Allergy & Precautions, H&P , NPO status , Patient's Chart, lab work & pertinent test results  History of Anesthesia Complications Negative for: history of anesthetic complications  Airway Mallampati: II  TM Distance: >3 FB Neck ROM: full    Dental no notable dental hx. (+) Teeth Intact   Pulmonary former smoker,    Pulmonary exam normal breath sounds clear to auscultation       Cardiovascular hypertension, Pt. on medications and Pt. on home beta blockers Normal cardiovascular exam Rhythm:regular Rate:Normal     Neuro/Psych negative neurological ROS  negative psych ROS   GI/Hepatic negative GI ROS, Neg liver ROS,   Endo/Other  Morbid obesity  Renal/GU negative Renal ROS  negative genitourinary   Musculoskeletal   Abdominal (+) + obese,   Peds  Hematology  (+) Blood dyscrasia, anemia ,   Anesthesia Other Findings   Reproductive/Obstetrics (+) Pregnancy                             Anesthesia Physical Anesthesia Plan  ASA: 3  Anesthesia Plan: Epidural   Post-op Pain Management:    Induction:   PONV Risk Score and Plan:   Airway Management Planned:   Additional Equipment:   Intra-op Plan:   Post-operative Plan:   Informed Consent: I have reviewed the patients History and Physical, chart, labs and discussed the procedure including the risks, benefits and alternatives for the proposed anesthesia with the patient or authorized representative who has indicated his/her understanding and acceptance.       Plan Discussed with:   Anesthesia Plan Comments:         Anesthesia Quick Evaluation

## 2021-03-06 NOTE — Progress Notes (Signed)
Labor Progress Note Teresa Roman is a 30 y.o. G1P0000 at [redacted]w[redacted]d presented for IOL for CHTN.  S: Comfortable with epidural in place. No concerns at this time.  O:  BP 134/69   Pulse 82   Temp 98.2 F (36.8 C) (Oral)   Resp (!) 22   Ht 5\' 6"  (1.676 m)   Wt 126.6 kg   LMP  (LMP Unknown)   SpO2 96%   BMI 45.06 kg/m  EFM: baseline 140/min-moderate variability/rare accels/recurrent variable and late decels with contractions Toco: contractions every 2-5 min, 200 MVUs  CVE: Dilation: 6 Effacement (%): 80 Cervical Position: Middle Station: -1 Presentation: Vertex Exam by:: Dr. Kerry Dory   A&P: 30 y.o. G1P0000 [redacted]w[redacted]d presented for IOL for CHTN. #Labor/FWB: Fetal intolerance of labor evident now, pitocin stopped.  This has been ongoing, and no cervical change in over two hours despite adequate contractions.  Discussed situation with patient, cesarean section recommended.  The risks of surgery were discussed with the patient including but were not limited to: bleeding which may require transfusion or reoperation; infection which may require antibiotics; injury to bowel, bladder, ureters or other surrounding organs; injury to the fetus; need for additional procedures including hysterectomy in the event of a life-threatening hemorrhage; formation of adhesions; placental abnormalities with subsequent pregnancies; incisional problems; thromboembolic phenomenon and other postoperative/anesthesia complications.  The patient concurred with the proposed plan, giving informed written consent for the procedure. . Anesthesia and OR aware. Preoperative prophylactic antibiotics and SCDs ordered on call to the OR.  To OR when ready. #Pain: epidural in place #GBS negative #cHTN: Blood pressure s stable. Preeclampsia labs unremarkable. Will continue to monitor.   Verita Schneiders, MD 9:46 AM

## 2021-03-06 NOTE — Procedures (Signed)
Spoke with Dr. Harolyn Rutherford concerning the FHR tracing. She says she has been watching the tracing and will continue to watch it.

## 2021-03-06 NOTE — Transfer of Care (Signed)
Immediate Anesthesia Transfer of Care Note  Patient: Teresa Roman  Procedure(s) Performed: CESAREAN SECTION  Patient Location: PACU  Anesthesia Type:Epidural  Level of Consciousness: awake  Airway & Oxygen Therapy: Patient Spontanous Breathing  Post-op Assessment: Report given to RN  Post vital signs: Reviewed and stable  Last Vitals:  Vitals Value Taken Time  BP 127/87 03/06/21 1130  Temp 37.3 C 03/06/21 1130  Pulse 90 03/06/21 1131  Resp 23 03/06/21 1131  SpO2 99 % 03/06/21 1131  Vitals shown include unvalidated device data.  Last Pain:  Vitals:   03/06/21 0931  TempSrc:   PainSc: 0-No pain         Complications: No notable events documented.

## 2021-03-06 NOTE — Progress Notes (Signed)
Labor Progress Note Teresa Roman is a 30 y.o. G1P0000 at [redacted]w[redacted]d presented for IOL for CHTN.  S: Pt resting comfortably with epidural in place. No concerns at this time.  O:  BP 132/72   Pulse 84   Temp 98.2 F (36.8 C) (Oral)   Resp 17   Ht 5\' 6"  (1.676 m)   Wt 126.6 kg   LMP  (LMP Unknown)   SpO2 96%   BMI 45.06 kg/m  EFM: baseline 140/min-moderate variability/rare accels/recurrent variable decels with contractions Toco: contractions every 2-5 min, 200 MVUs  CVE: Dilation: 6 Effacement (%): 80 Cervical Position: Middle Station: -1 Presentation: Vertex Exam by:: Cordell Guercio, MD   A&P: 30 y.o. G1P0000 [redacted]w[redacted]d presented for IOL for CHTN. #Labor: S/p cytotec x3 and FB. Pitocin started at 2315 on 6/25. SROM for clear fluid at approximately 0215 on 6/26. IUPC placed given decels in [place] with difficult to trace toco. Starting amnioinfusion with recurrent variable decels; will continue to up-titrate pitocin pending fetal tolerance. #Pain: epidural in place #FWB: Category 2 strip given recurrent variable decels; will initiate amnioinfusion at this time and continue to monitor closely. #GBS negative #cHTN: Blood pressure normal s/p epidural. Preeclampsia labs unremarkable on admission except for Hgb 10.3. Continued on po labetalol 400mg  BID. Pt asymptomatic. Will continue to monitor.  Randa Ngo, MD 7:33 AM

## 2021-03-06 NOTE — Progress Notes (Addendum)
Labor Progress Note Teresa Roman is a 30 y.o. G1P0000 at [redacted]w[redacted]d presented for IOL for CHTN  S: Pt resting comfortably with epidural in place.  O:  BP (!) 115/58   Pulse 79   Temp 98.2 F (36.8 C) (Oral)   Resp 16   Ht 5\' 6"  (1.676 m)   Wt 126.6 kg   LMP  (LMP Unknown)   SpO2 96%   BMI 45.06 kg/m  EFM: baseline 140 bpm/mod variability/ + accels/early decels and intermittent late decels Toco: rare contractions SVE: 6/80/-1   A/P: 30 y.o. G1P0000 [redacted]w[redacted]d presenting for IOL secondary to cHTN. Labor: Progressing well. S/p cytotec x3 and FB. Pitocin started at 2315 on 6/25. SROM for clear fluid at approximately 0215 on 6/26. FWB: Cat II strip given intermittent late decels; reassuringly, pt is progressing well with moderate variability and +accels. Will continue to monitor closely. Pain: epidural CHTN: Blood pressure low normal s/p epidural. Preeclampsia labs unremarkable on admission except for Hgb 10.3. Continued on po labetalol 400 mg BID. Pt asymptomatic. Will continue to monitor.  Randa Ngo, MD 5:45 AM

## 2021-03-06 NOTE — Progress Notes (Signed)
**  Late Entry  Labor Progress Note                                                                                                                                                                                                                                                                                                                    Teresa Roman is a 30 y.o. G1P0000 at [redacted]w[redacted]d admitted for  IOL for cHTN.   Subjective: Teresa Roman is resting comfortably with epidural. Pt denies any pelvic pain or pressure at this time.   Objective: Vitals:   03/06/21 0801 03/06/21 0832 03/06/21 0902 03/06/21 0931  BP: 133/81 122/81 123/74 134/69  Pulse: 84 (!) 103 84 82  Resp: 20 (!) 22 (!) 22 (!) 22  Temp:      TempSrc:      SpO2:      Weight:      Height:        FHT:  FHR: 150 bpm, variability: moderate,  accelerations:  Present,  decelerations:  Present early and variable decelerations.  UC:   regular, every 2-4 minutes SVE:   Dilation: 6 Effacement (%): 80 Station: -1 Exam by:: Dr. Kerry Dory  Assessment / Plan: G1P0000 30 y.o. [redacted]w[redacted]d Induction of labor due to [facility]. S/p epidural, Pit, SROM, Amnioinfusion.  Introductions given to the patient.   Labor:  Cervix unchanged. Remains cat II tracing despite interventions. Consulted MD. (Please see MD note)  Preeclampsia:  labs stable Fetal Wellbeing:  Category II, moderate variability, recurrent early, variable decelerations. Occasional prolongs resolved with position changes.  Pain Control:  Epidural  Signed:  Wyonia Fontanella Isaias Sakai) Rollene Rotunda, BSN, RNC-OB  Student Nurse-Midwife   03/06/2021  8:57 AM

## 2021-03-07 LAB — CBC
HCT: 25.3 % — ABNORMAL LOW (ref 36.0–46.0)
Hemoglobin: 8.5 g/dL — ABNORMAL LOW (ref 12.0–15.0)
MCH: 31.6 pg (ref 26.0–34.0)
MCHC: 33.6 g/dL (ref 30.0–36.0)
MCV: 94.1 fL (ref 80.0–100.0)
Platelets: 182 10*3/uL (ref 150–400)
RBC: 2.69 MIL/uL — ABNORMAL LOW (ref 3.87–5.11)
RDW: 12.7 % (ref 11.5–15.5)
WBC: 16.6 10*3/uL — ABNORMAL HIGH (ref 4.0–10.5)
nRBC: 0 % (ref 0.0–0.2)

## 2021-03-07 LAB — COMPREHENSIVE METABOLIC PANEL
ALT: 12 U/L (ref 0–44)
AST: 16 U/L (ref 15–41)
Albumin: 2.4 g/dL — ABNORMAL LOW (ref 3.5–5.0)
Alkaline Phosphatase: 62 U/L (ref 38–126)
Anion gap: 6 (ref 5–15)
BUN: 11 mg/dL (ref 6–20)
CO2: 23 mmol/L (ref 22–32)
Calcium: 8.7 mg/dL — ABNORMAL LOW (ref 8.9–10.3)
Chloride: 103 mmol/L (ref 98–111)
Creatinine, Ser: 0.78 mg/dL (ref 0.44–1.00)
GFR, Estimated: >60 mL/min (ref >60)
Glucose, Bld: 129 mg/dL — ABNORMAL HIGH (ref 70–99)
Potassium: 4 mmol/L (ref 3.5–5.1)
Sodium: 132 mmol/L — ABNORMAL LOW (ref 135–145)
Total Bilirubin: 0.4 mg/dL (ref 0.3–1.2)
Total Protein: 5.4 g/dL — ABNORMAL LOW (ref 6.5–8.1)

## 2021-03-07 NOTE — Progress Notes (Addendum)
POSTPARTUM PROGRESS NOTE  Subjective: Teresa Roman is a 30 y.o. G1P1001 s/p primary LTCS at [redacted]w[redacted]d.  She reports she doing well. No acute events overnight. She denies any problems with ambulating, voiding or po intake. Denies nausea or vomiting. She has passed flatus. Pain is well controlled.  Lochia is moderate.  Objective: Blood pressure 130/69, pulse 87, temperature 98.6 F (37 C), resp. rate 20, height 5\' 6"  (1.676 m), weight 126.6 kg, SpO2 98 %, unknown if currently breastfeeding.  Physical Exam:  General: alert, cooperative and no distress Chest: no respiratory distress Abdomen: soft, non-tender. Wound vac with good suction. Uterine Fundus: firm and at level of umbilicus Extremities: No calf swelling or tenderness  no LE edema  Recent Labs    03/06/21 0231 03/07/21 0442  HGB 10.4* 8.5*  HCT 31.2* 25.3*    Assessment/Plan: Teresa Roman is a 30 y.o. G1P1001 s/p primary LTCS at [redacted]w[redacted]d.   Routine Postpartum Care: Doing well, pain well-controlled.  -- Continue routine care, lactation support  -- Contraception: POPs -- Feeding: formula -- gHTN: Blood pressure normal s/p delivery. Pt asymptomatic. Will start procardia 30mg  daily and continue to monitor. -- Acute Blood Loss Anemia: Hgb 10.4 > 8.5 >plan for IV venofer today.  Dispo: Plan for discharge POD#2-3.  Teresa Ngo, MD OB Fellow, Faculty Practice 03/07/2021 6:49 AM

## 2021-03-07 NOTE — Progress Notes (Signed)
Patient ID: Zaraya Delauder, female   DOB: [birth year], 30 y.o.   MRN: [id number]  POSTPARTUM PROGRESS NOTE: POD#1  Subjective: Chealsey Miyamoto is a 30 y.o. G1P1001 s/p pLTCS at [redacted]w[redacted]d.  She reports she doing well. No acute events overnight. She denies any problems with ambulating, voiding or po intake. Denies nausea or vomiting. She has  passed flatus. Pain is well controlled.  Lochia is moderate.  Objective: Blood pressure 130/69, pulse 87, temperature 98.6 F (37 C), resp. rate 20, height 5\' 6"  (1.676 m), weight 126.6 kg, SpO2 98 %, unknown if currently breastfeeding.  Physical Exam:  General: Alert, oriented, cooperative and no distress. Chest: No respiratory distress. Normal respiratory effort.  Abdomen: Soft, non-tender. Wound Vac in place with good suction.   Uterine Fundus: Firm and at level of umbilicus Extremities: No calf swelling or tenderness  no lower extremity edema.  Recent Labs    03/06/21 0231 03/07/21 0442  HGB 10.4* 8.5*  HCT 31.2* 25.3*    Assessment/Plan: Sue Fernicola is a 30 y.o. G1P1001 s/p pLTCS at [redacted]w[redacted]d. She is POD#1.  Routine Postpartum Care:  Patient is doing well and her pain well-controlled.  -- Continue routine care, lactation support  -- Contraception: Progesterone only pills -- Feeding: Formula -- Gestational Hypertension: On review her blood pressures have been normal following cesarean delivery. Patient will continue on procardia-XL 30 mg daily. She is asymptomatic. We will continue to monitor. -- Acute blood loss anemia: Hemoglobin level has dropped from 10.4 to 8.5. Patient will receive IV venofer today for iron repletion.   Dispo: Plan for discharge POD# 2-3.  Marcene Corning 03/07/2021 7:13 AM

## 2021-03-08 ENCOUNTER — Other Ambulatory Visit (HOSPITAL_COMMUNITY): Payer: Self-pay

## 2021-03-08 MED ORDER — IBUPROFEN 600 MG PO TABS
600.0000 mg | ORAL_TABLET | Freq: Four times a day (QID) | ORAL | 0 refills | Status: AC
  Filled 2021-03-08: qty 30, 8d supply, fill #0

## 2021-03-08 MED ORDER — FERROUS SULFATE 325 (65 FE) MG PO TABS
325.0000 mg | ORAL_TABLET | ORAL | 2 refills | Status: AC
  Filled 2021-03-08: qty 30, 60d supply, fill #0
  Filled 2021-05-25 – 2021-07-07 (×3): qty 30, 60d supply, fill #1

## 2021-03-08 MED ORDER — NIFEDIPINE ER 60 MG PO TB24
60.0000 mg | ORAL_TABLET | Freq: Every day | ORAL | 0 refills | Status: DC
  Filled 2021-03-08: qty 45, 45d supply, fill #0

## 2021-03-08 MED ORDER — NORETHINDRONE 0.35 MG PO TABS
1.0000 | ORAL_TABLET | Freq: Every day | ORAL | 6 refills | Status: DC
  Filled 2021-03-08: qty 56, 56d supply, fill #0
  Filled 2021-05-25: qty 56, 56d supply, fill #1
  Filled 2021-07-07: qty 56, 56d supply, fill #2
  Filled 2021-09-07: qty 56, 56d supply, fill #3
  Filled 2021-11-09: qty 56, 56d supply, fill #4
  Filled 2022-01-08 – 2022-01-09 (×2): qty 56, 56d supply, fill #5

## 2021-03-08 MED ORDER — NIFEDIPINE ER OSMOTIC RELEASE 30 MG PO TB24
60.0000 mg | ORAL_TABLET | Freq: Every day | ORAL | Status: DC
  Administered 2021-03-08: 60 mg via ORAL
  Filled 2021-03-08: qty 2

## 2021-03-08 MED ORDER — OXYCODONE HCL 5 MG PO TABS
5.0000 mg | ORAL_TABLET | Freq: Four times a day (QID) | ORAL | 0 refills | Status: AC | PRN
  Filled 2021-03-08: qty 15, 2d supply, fill #0

## 2021-03-08 MED ORDER — COCONUT OIL OIL: 1.0000 "application " | TOPICAL_OIL | 0 refills | Status: AC | PRN

## 2021-03-08 NOTE — Progress Notes (Signed)
RN switched negative pressure dressing over to take home device.  Pt educated about lights and alarms, device will stay on one week week, per OB.  Pt will follow up in OB office. Device information discharge packet given to patient.

## 2021-03-09 ENCOUNTER — Telehealth: Payer: Self-pay | Admitting: *Deleted

## 2021-03-09 NOTE — Telephone Encounter (Signed)
Transition Care Management Follow-up Telephone Call Date of discharge and from where: 03/08/2021 - [facility] How have you been since you were released from the hospital? "Doing okay" Any questions or concerns? No  Items Reviewed: Did the pt receive and understand the discharge instructions provided? Yes  Medications obtained and verified? Yes  Other? No  Any new allergies since your discharge? No  Dietary orders reviewed? No Do you have support at home? Yes    Functional Questionnaire: (I = Independent and D = Dependent) ADLs: I  Bathing/Dressing- I  Meal Prep- I  Eating- I  Maintaining continence- I  Transferring/Ambulation- I  Managing Meds- I  Follow up appointments reviewed:  PCP Hospital f/u appt confirmed? No   Specialist Hospital f/u appt confirmed? No   Are transportation arrangements needed? No  If their condition worsens, is the pt aware to call PCP or go to the Emergency Dept.? Yes Was the patient provided with contact information for the PCP's office or ED? Yes Was to pt encouraged to call back with questions or concerns? Yes

## 2021-03-10 ENCOUNTER — Encounter: Payer: Self-pay | Admitting: Family Medicine

## 2021-03-16 ENCOUNTER — Telehealth: Payer: Self-pay | Admitting: Clinical

## 2021-03-16 ENCOUNTER — Other Ambulatory Visit: Payer: Self-pay

## 2021-03-16 ENCOUNTER — Telehealth: Payer: Self-pay | Admitting: General Practice

## 2021-03-16 ENCOUNTER — Ambulatory Visit (INDEPENDENT_AMBULATORY_CARE_PROVIDER_SITE_OTHER): Payer: Medicaid Other | Admitting: General Practice

## 2021-03-16 ENCOUNTER — Telehealth (HOSPITAL_COMMUNITY): Payer: Self-pay | Admitting: *Deleted

## 2021-03-16 VITALS — BP 127/81 | HR 96 | Ht 67.0 in | Wt 250.0 lb

## 2021-03-16 DIAGNOSIS — Z0130 Encounter for examination of blood pressure without abnormal findings: Secondary | ICD-10-CM

## 2021-03-16 DIAGNOSIS — Z5189 Encounter for other specified aftercare: Secondary | ICD-10-CM

## 2021-03-16 NOTE — Telephone Encounter (Signed)
Left HIPPA-compliant message to call back Roselyn Reef from [employer] for [employer] at [facility] at  [phone] [place] office).   Attempt to inform pt about virtual visit scheduled tomorrow, 03/17/21 at 3:45pm.

## 2021-03-16 NOTE — Progress Notes (Signed)
Patient was assessed and managed by nursing staff during this encounter. I have reviewed the chart and agree with the documentation and plan. I have also made any necessary editorial changes.  Griffin Basil, MD 03/16/2021 2:22 PM

## 2021-03-16 NOTE — BH Specialist Note (Signed)
Pt did not arrive to video visit and did not answer the phone; Left HIPPA-compliant message to call back Teresa Roman from [facility] at [facility] at  [phone] (Loden Laurent's office).  ?; left MyChart message for patient.  ? ?

## 2021-03-16 NOTE — Progress Notes (Signed)
Patient presents to office today for wound check & BP check following primary c-section on 6/26. Patient reports taking Procardia BID daily as prescribed- denies headaches. BP 127/81. Wound vac removed. Incision is well approximated and appears to be healing well. Wound care and signs & symptoms of infection reviewed with patient. Patient and partner asked about baby blues regarding length of duration/normal patterns. They both express the patient having difficulty since delivery especially in light of traumatic birth. Recommended scheduling follow up appt with Roselyn Reef to discuss further. Patient will follow up at postpartum visit.   Koren Bound RN BSN 03/16/21

## 2021-03-16 NOTE — Telephone Encounter (Signed)
Patient voiced no concerns regarding her health at this time. Reported that she saw OB today for incision and BP check. Stated that incision looks good and that BP was good. RN reviewed signs of hypertension with patient - verbalized understanding of when to call MD. Denied symptoms at this time. EPDS score = 11. Patient reported that she has an appointment tomorrow with Michaelene Song with [facility]. Appointment date and time verified - virtual appointment scheduled for 1545 on 03/17/21.  Patient reported that infant's umbilical stump "fell off just now." Patient asked how she should clean umbilical area. RN advised patient to clean around site with warm, clean washcloth. Patient stated that infant's next peds appointment is scheduled for 7/11. Patient denied any signs of infection.  Erline Levine, RN, 03/16/21, 2033

## 2021-03-16 NOTE — Telephone Encounter (Signed)
Called patient regarding Teresa Roman appt tomorrow, no answer- left message stating I was calling to inform of her an appt that has been scheduled for tomorrow at 345 that is virtual. Please call us back if you have questions.

## 2021-03-17 ENCOUNTER — Ambulatory Visit: Payer: Medicaid Other | Admitting: Clinical

## 2021-03-17 DIAGNOSIS — Z5329 Procedure and treatment not carried out because of patient's decision for other reasons: Secondary | ICD-10-CM

## 2021-03-17 DIAGNOSIS — Z91199 Patient's noncompliance with other medical treatment and regimen due to unspecified reason: Secondary | ICD-10-CM

## 2021-04-04 ENCOUNTER — Other Ambulatory Visit: Payer: Self-pay

## 2021-04-04 ENCOUNTER — Ambulatory Visit: Payer: Medicaid Other | Admitting: Family Medicine

## 2021-04-04 ENCOUNTER — Ambulatory Visit (INDEPENDENT_AMBULATORY_CARE_PROVIDER_SITE_OTHER): Payer: Medicaid Other | Admitting: Student

## 2021-04-04 MED ORDER — NIFEDIPINE ER 60 MG PO TB24: 60.0000 mg | ORAL_TABLET | Freq: Every day | ORAL | 0 refills | Status: DC

## 2021-04-04 NOTE — Progress Notes (Signed)
[place] Partum Visit Note  Teresa Roman is a 30 y.o. G68P1001 female who presents for a postpartum visit. She is 4 weeks postpartum following a primary cesarean section.  I have fully reviewed the prenatal and intrapartum course. The delivery was at 39.1 gestational weeks.  Anesthesia: epidural. Postpartum course has been uneventful. Baby is doing well.  Baby is feeding by bottle D[place] Good start . Bleeding no bleeding. Bowel function is normal. Bladder function is normal. Patient is not sexually active. Contraception method is oral progesterone-only contraceptive. Postpartum depression screening: negative.   The pregnancy intention screening data noted above was reviewed. Potential methods of contraception were discussed. The patient elected to proceed with No data recorded.   Edinburgh Postnatal Depression Scale - 04/04/21 1050       Edinburgh Postnatal Depression Scale:  In the Past 7 Days   I have been able to laugh and see the funny side of things. 0    I have looked forward with enjoyment to things. 0    I have blamed myself unnecessarily when things went wrong. 0    I have been anxious or worried for no good reason. 2    I have felt scared or panicky for no good reason. 0    Things have been getting on top of me. 0    I have been so unhappy that I have had difficulty sleeping. 0    I have felt sad or miserable. 0    I have been so unhappy that I have been crying. 0    The thought of harming myself has occurred to me. 0    Edinburgh Postnatal Depression Scale Total 2             Health Maintenance Due  Topic Date Due   COVID-19 Vaccine (1) Never done    The following portions of the patient's history were reviewed and updated as appropriate: allergies, current medications, past family history, past medical history, past social history, past surgical history, and problem list.  Review of Systems Pertinent items are noted in HPI.  Objective:  BP 136/90   Pulse 73    Wt 247 lb (112 kg)   LMP  (LMP Unknown)   Breastfeeding No   BMI 38.69 kg/m    General:  alert, cooperative, and no distress   Breasts:  normal  Lungs: clear to auscultation bilaterally  Heart:  regular rate and rhythm, S1, S2 normal, no murmur, click, rub or gallop  Abdomen: soft, non-tender; bowel sounds normal; no masses,  no organomegaly   Wound well approximated incision  GU exam:  not indicated       Assessment:    There are no diagnoses linked to this encounter.  Healthy  postpartum exam.   Plan:   Essential components of care per ACOG recommendations:  1.  Mood and well being: Patient with negative depression screening today. Reviewed local resources for support.  - Patient tobacco use? No.   - hx of drug use? Yes   2. Infant care and feeding:  -Patient currently breastmilk feeding? No.  -Social determinants of health (SDOH) reviewed in EPIC. No concerns  3. Sexuality, contraception and birth spacing - Patient does not want a pregnancy in the next year.  Desired family size is 2 children.  - Reviewed forms of contraception in tiered fashion. Patient desired oral progesterone-only contraceptive today.  She has rx at home.  - Discussed birth spacing of 18 months  4.  Sleep and fatigue -Encouraged family/partner/community support of 4 hrs of uninterrupted sleep to help with mood and fatigue  5. Physical Recovery  - Discussed patients delivery and complications. She describes her labor as mixed.She had an urgen c/section due to [facility]; we reviewed her labor course and tracing, discussed what happened and patient had no questions.  - Patient had a C-section emergent. Patient had a  None  laceration. Perineal healing reviewed. Patient expressed understanding - Patient has urinary incontinence? No. - Patient is safe to resume physical and sexual activity  6.  Health Maintenance - HM due items addressed Yes - Last pap smear  Diagnosis  Date Value Ref Range Status   10/15/2020   Final   - Negative for intraepithelial lesion or malignancy (NILM)   Pap smear not done at today's visit.  -Breast Cancer screening indicated? No.   7. Chronic Disease/Pregnancy Condition follow up: Hypertension -Patient is checking BP at home; usually 120s/60-80s. She is taking nifedipine 60 mg; refill given and name and number of family practice clinic to call. Patient is motivated to improve her health for the sake of her daughter.  -Patient is a chronic hypertensive; she will follow up with PCP - PCP follow up  [place], [place] [facility] [place], [place]

## 2021-04-04 NOTE — Patient Instructions (Addendum)
[facility] for blood pressure management:   Address: [address], [place], [place] [address] Hours:  Open ? Closes 5PM Phone: [phone]

## 2021-05-20 DIAGNOSIS — H5213 Myopia, bilateral: Secondary | ICD-10-CM | POA: Diagnosis not present

## 2021-05-25 ENCOUNTER — Other Ambulatory Visit: Payer: Self-pay

## 2021-05-25 DIAGNOSIS — I10 Essential (primary) hypertension: Secondary | ICD-10-CM

## 2021-05-26 ENCOUNTER — Other Ambulatory Visit: Payer: Self-pay

## 2021-05-26 MED ORDER — NIFEDIPINE ER 60 MG PO TB24
60.0000 mg | ORAL_TABLET | Freq: Every day | ORAL | 0 refills | Status: AC
  Filled 2021-05-26: qty 30, 30d supply, fill #0

## 2021-05-27 ENCOUNTER — Other Ambulatory Visit: Payer: Self-pay

## 2021-07-08 ENCOUNTER — Other Ambulatory Visit: Payer: Self-pay

## 2021-07-13 ENCOUNTER — Other Ambulatory Visit: Payer: Self-pay

## 2021-09-08 ENCOUNTER — Other Ambulatory Visit: Payer: Self-pay

## 2021-09-12 ENCOUNTER — Other Ambulatory Visit: Payer: Self-pay

## 2021-11-10 ENCOUNTER — Other Ambulatory Visit (HOSPITAL_COMMUNITY): Payer: Self-pay

## 2022-01-09 ENCOUNTER — Other Ambulatory Visit (HOSPITAL_COMMUNITY): Payer: Self-pay

## 2022-01-12 ENCOUNTER — Other Ambulatory Visit (HOSPITAL_COMMUNITY): Payer: Self-pay

## 2022-01-21 ENCOUNTER — Other Ambulatory Visit (HOSPITAL_COMMUNITY): Payer: Self-pay

## 2022-01-23 ENCOUNTER — Other Ambulatory Visit (HOSPITAL_COMMUNITY): Payer: Self-pay

## 2022-02-01 ENCOUNTER — Other Ambulatory Visit (HOSPITAL_COMMUNITY): Payer: Self-pay

## 2022-02-08 ENCOUNTER — Other Ambulatory Visit: Payer: Self-pay | Admitting: Lactation Services

## 2022-02-08 ENCOUNTER — Encounter: Payer: Self-pay | Admitting: Obstetrics and Gynecology

## 2022-02-08 ENCOUNTER — Other Ambulatory Visit (HOSPITAL_COMMUNITY): Payer: Self-pay

## 2022-02-08 MED ORDER — NORETHINDRONE 0.35 MG PO TABS: 1.0000 | ORAL_TABLET | Freq: Every day | ORAL | 0 refills | Status: AC

## 2022-02-08 NOTE — Progress Notes (Signed)
3 month supply of OCP's given. Patient informed vis Mychart she needs to make annual exam appointment to be able to get further refills.

## 2022-06-06 ENCOUNTER — Other Ambulatory Visit (HOSPITAL_COMMUNITY): Payer: Self-pay

## 2022-06-29 IMAGING — US US MFM OB COMP +14 WKS
1 series · 13 of 28 positions shown · non-contrast
Comparison: none

[Series 1: us mfm ob comp +14 wks · 13 of 47 slices shown]
[im 2/47]
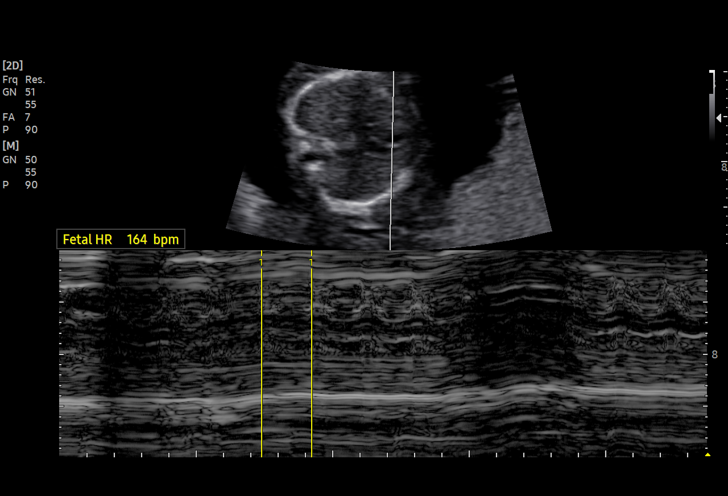
[im 6/47]
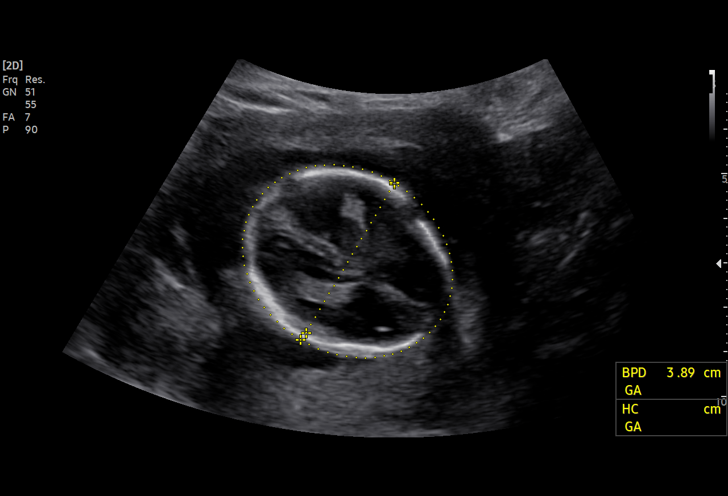
[im 9/47]
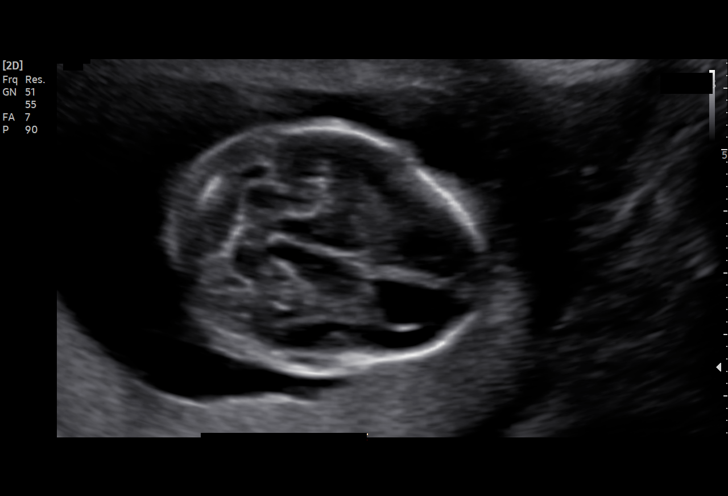
[im 12/47]
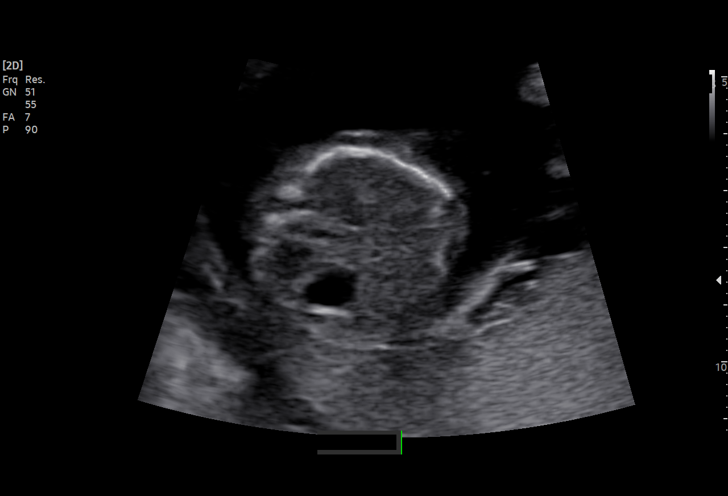
[im 16/47]
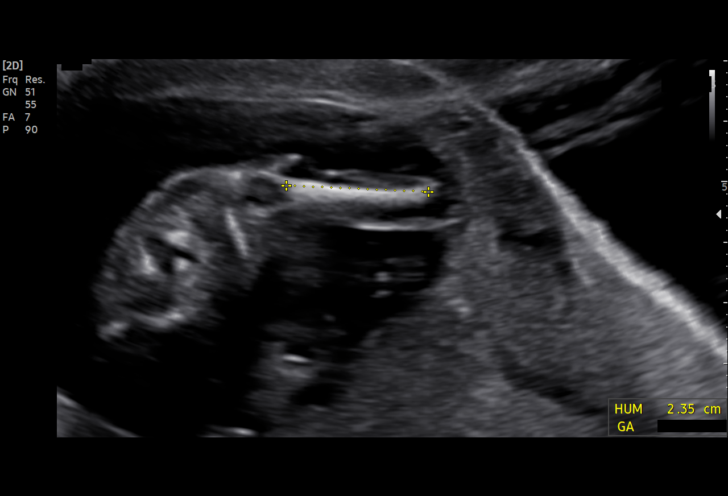
[im 19/47]
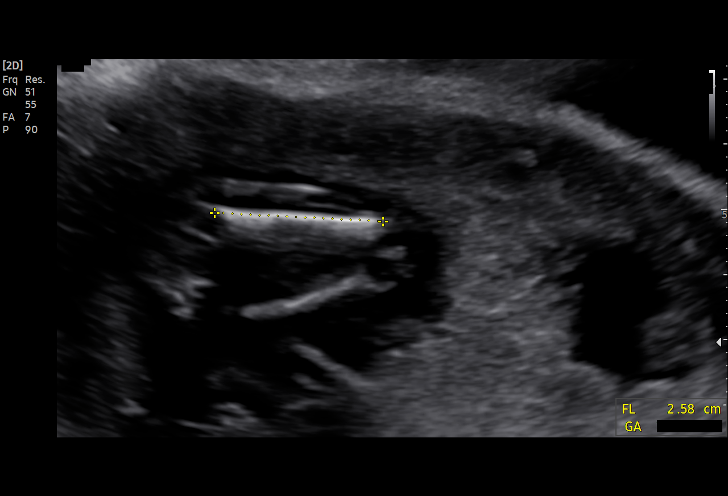
[im 24/47]
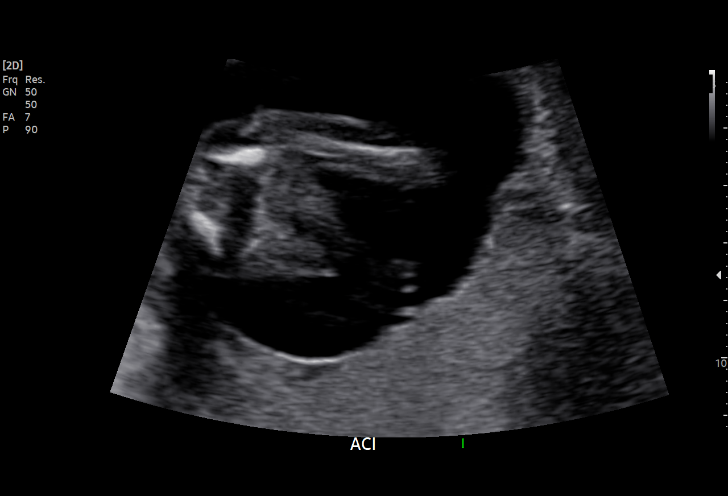
[im 28/47]
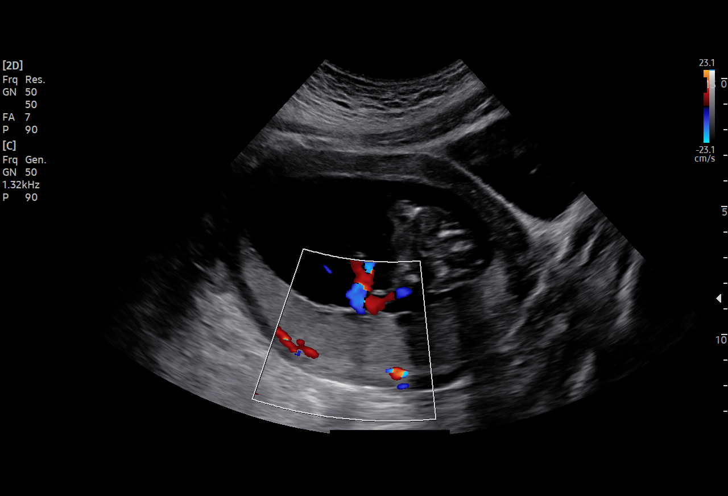
[im 31/47]
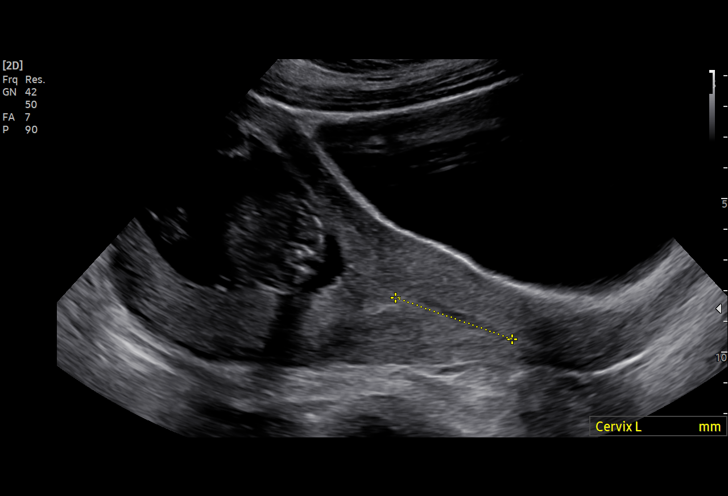
[im 35/47]
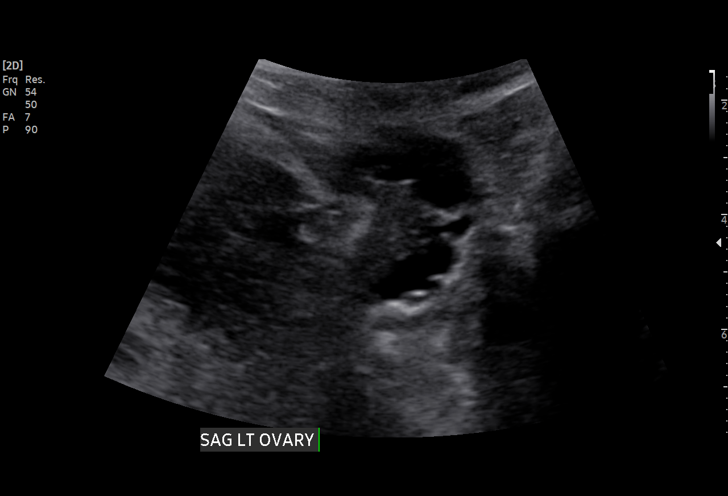
[im 38/47]
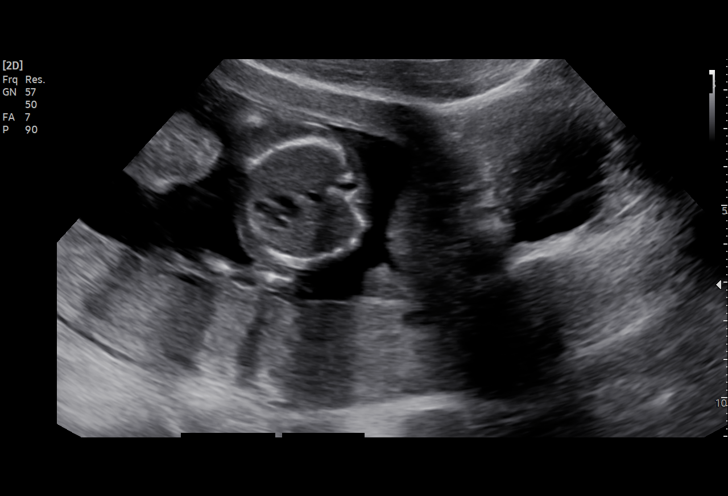
[im 41/47]
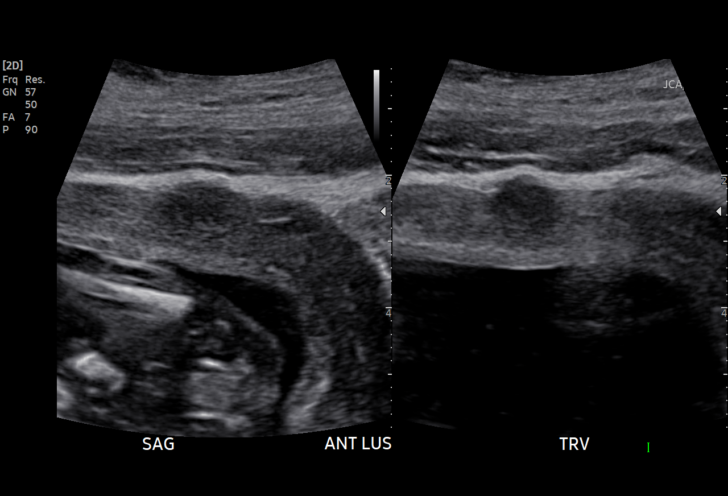
[im 45/47]
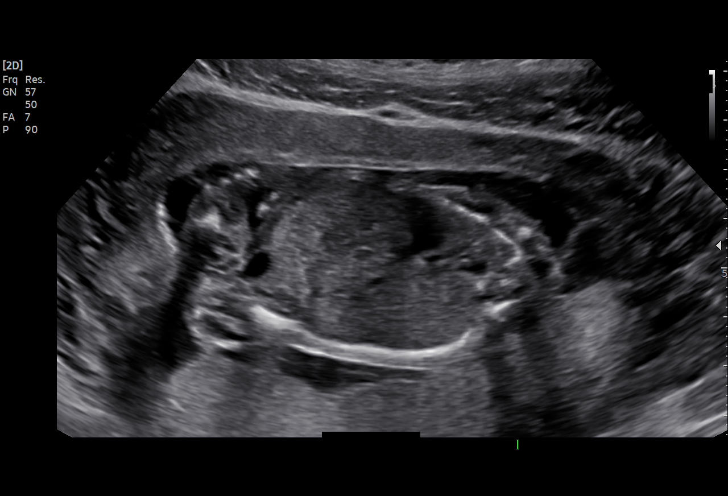

[13 of 28 positions shown; findings below may reference images not displayed]

1  US MFM OB COMP + 14 WK                76805.01    SAVIO LOCKLEAR

Indications

 Hypertension - Chronic/Pre-existing
 Obesity complicating pregnancy, second
 trimester
 Encounter for antenatal screening for
 malformations
 17 weeks gestation of pregnancy
Fetal Evaluation

 Num Of Fetuses:         1
 Fetal Heart Rate(bpm):  164
 Cardiac Activity:       Observed
 Presentation:           Transverse, head to maternal left
 Placenta:               Posterior Previa
 P. Cord Insertion:      Visualized

 Amniotic Fluid
 AFI FV:      Within normal limits

                             Largest Pocket(cm)

Biometry

 BPD:      38.7  mm     G. Age:  17w 5d         66  %    CI:        70.95   %    70 - 86
                                                         FL/HC:      16.7   %    14.6 -
 HC:      146.4  mm     G. Age:  17w 6d         61  %    HC/AC:      1.22        1.07 -
 AC:       120   mm     G. Age:  17w 5d         58  %    FL/BPD:     63.0   %
 FL:       24.4  mm     G. Age:  17w 3d         42  %    FL/AC:      20.3   %    20 - 24
 HUM:      22.4  mm     G. Age:  16w 6d         40  %
 CER:     18.14  mm     G. Age:  18w 0d         74  %
 NFT:      4.55  mm

 CM:        6.4  mm

 Est. FW:     200  gm      0 lb 7 oz     53  %
OB History

 Gravidity:    1         Term:   0
 Living:       0
Gestational Age

 U/S Today:     17w 5d                                        EDD:   03/10/21
 Best:          17w 3d     Det. By:  Previous Ultrasound      EDD:   03/12/21
                                     (10/05/20)
Anatomy

 Cranium:               Appears normal         Aortic Arch:            Not well visualized
 Cavum:                 Appears normal         Ductal Arch:            Not well visualized
 Ventricles:            Not well visualized    Diaphragm:              Appears normal
 Choroid Plexus:        Appears normal         Stomach:                Appears normal, left
                                                                       sided
 Cerebellum:            Appears normal         Abdomen:                Appears normal
 Posterior Fossa:       Appears normal         Abdominal Wall:         Appears nml (cord
                                                                       insert, abd wall)
 Nuchal Fold:           Appears normal         Cord Vessels:           2 Vessel Cord ?
 Face:                  Orbits nl; profile not Kidneys:                Not well visualized
                        well visualized
 Lips:                  Not well visualized    Bladder:                Appears normal
 Thoracic:              Appears normal         Spine:                  Not well visualized
 Heart:                 Not well visualized    Upper Extremities:      Visualized
 RVOT:                  Not well visualized    Lower Extremities:      Visualized
 LVOT:                  Not well visualized

 Other:  Technically difficult due to maternal habitus and fetal position.
Cervix Uterus Adnexa

 Cervix
 Length:              4  cm.

 Right Ovary
 Within normal limits.

 Left Ovary
 Within normal limits.
Comments

 This patient had an ultrasound performed through radiology
 as she was presumed to be at an early gestational age.  She
 has a history of chronic hypertension.

 Based on the fetal biometry measurements obtained today,
 her EDC was changed to March 12, 2021, making her 17 weeks
 and 3 days today.

 The views of the fetal anatomy were limited today due to the
 maternal body habitus and her early gestational age.  A
 possible two-vessel umbilical cord and placenta previa were
 noted.

 A follow-up exam was scheduled in 3 weeks for a detailed
 fetal anatomy scan and to confirm her dates.  We will assess
 the fetal anatomy at that time.

 Due to her history of chronic hypertension, we will continue to
 follow her with serial growth ultrasounds throughout her
 pregnancy.  She should have a screening test for fetal
 aneuploidy drawn at her next prenatal visit.

## 2022-08-20 ENCOUNTER — Emergency Department (HOSPITAL_COMMUNITY): Payer: Medicaid Other

## 2022-08-20 ENCOUNTER — Other Ambulatory Visit: Payer: Self-pay

## 2022-08-20 ENCOUNTER — Emergency Department (HOSPITAL_COMMUNITY)
Admission: EM | Admit: 2022-08-20 | Discharge: 2022-08-21 | Discharge status: Against Medical Advice | Payer: Medicaid Other | Attending: Student | Admitting: Student

## 2022-08-20 ENCOUNTER — Encounter (HOSPITAL_COMMUNITY): Payer: Self-pay

## 2022-08-20 DIAGNOSIS — M7989 Other specified soft tissue disorders: Secondary | ICD-10-CM | POA: Diagnosis not present

## 2022-08-20 DIAGNOSIS — R2242 Localized swelling, mass and lump, left lower limb: Secondary | ICD-10-CM | POA: Diagnosis not present

## 2022-08-20 DIAGNOSIS — M79672 Pain in left foot: Secondary | ICD-10-CM | POA: Diagnosis not present

## 2022-08-20 DIAGNOSIS — F17290 Nicotine dependence, other tobacco product, uncomplicated: Secondary | ICD-10-CM | POA: Insufficient documentation

## 2022-08-20 DIAGNOSIS — Z5321 Procedure and treatment not carried out due to patient leaving prior to being seen by health care provider: Secondary | ICD-10-CM | POA: Diagnosis not present

## 2022-08-20 DIAGNOSIS — M7732 Calcaneal spur, left foot: Secondary | ICD-10-CM | POA: Diagnosis not present

## 2022-08-20 LAB — CBC WITH DIFFERENTIAL/PLATELET
Abs Immature Granulocytes: 0.01 10*3/uL (ref 0.00–0.07)
Basophils Absolute: 0 10*3/uL (ref 0.0–0.1)
Basophils Relative: 0 %
Eosinophils Absolute: 0.1 10*3/uL (ref 0.0–0.5)
Eosinophils Relative: 2 %
HCT: 32.5 % — ABNORMAL LOW (ref 36.0–46.0)
Hemoglobin: 11 g/dL — ABNORMAL LOW (ref 12.0–15.0)
Immature Granulocytes: 0 %
Lymphocytes Relative: 40 %
Lymphs Abs: 2.4 10*3/uL (ref 0.7–4.0)
MCH: 31.9 pg (ref 26.0–34.0)
MCHC: 33.8 g/dL (ref 30.0–36.0)
MCV: 94.2 fL (ref 80.0–100.0)
Monocytes Absolute: 0.4 10*3/uL (ref 0.1–1.0)
Monocytes Relative: 7 %
Neutro Abs: 3 10*3/uL (ref 1.7–7.7)
Neutrophils Relative %: 51 %
Platelets: 195 10*3/uL (ref 150–400)
RBC: 3.45 MIL/uL — ABNORMAL LOW (ref 3.87–5.11)
RDW: 11.9 % (ref 11.5–15.5)
WBC: 6 10*3/uL (ref 4.0–10.5)
nRBC: 0 % (ref 0.0–0.2)

## 2022-08-20 LAB — BASIC METABOLIC PANEL
Anion gap: 7 (ref 5–15)
BUN: 7 mg/dL (ref 6–20)
CO2: 26 mmol/L (ref 22–32)
Calcium: 9.1 mg/dL (ref 8.9–10.3)
Chloride: 105 mmol/L (ref 98–111)
Creatinine, Ser: 0.86 mg/dL (ref 0.44–1.00)
GFR, Estimated: >60 mL/min (ref >60)
Glucose, Bld: 98 mg/dL (ref 70–99)
Potassium: 3.5 mmol/L (ref 3.5–5.1)
Sodium: 138 mmol/L (ref 135–145)

## 2022-08-20 LAB — URIC ACID: Uric Acid, Serum: 4.8 mg/dL (ref 2.5–7.1)

## 2022-08-20 LAB — D-DIMER, QUANTITATIVE: D-Dimer, Quant: <0.27 ug/mL-FEU (ref 0.00–0.50)

## 2022-08-20 NOTE — ED Triage Notes (Signed)
Pt arrived to triage complaining of left foot and leg swelling that started bout 3 days ago. Denies trauma to region and denies redness or rashes

## 2022-08-20 NOTE — ED Provider Triage Note (Signed)
Emergency Medicine Provider Triage Evaluation Note  Teresa Roman , a 31 y.o. female  was evaluated in triage.  Pt complains of left ankle/foot swelling for the past three days. Patient complains of pain in the area. Denies known injury or trauma. Patient smokes cigars daily, denies recent travel, surgery, cancer history. No known history of gout  Review of Systems  Positive: As above Negative: As above  Physical Exam  BP (!) 155/97 (BP Location: Right Arm)   Pulse 76   Temp 98.4 F (36.9 C)   Resp 14   SpO2 98%  Gen:   Awake, no distress   Resp:  Normal effort  MSK:   Moves extremities without difficulty  Other:  Swelling noted around the left ankle and foot  Medical Decision Making  Medically screening exam initiated at 10:20 PM.  Appropriate orders placed.  Sonoma Firkus was informed that the remainder of the evaluation will be completed by another provider, this initial triage assessment does not replace that evaluation, and the importance of remaining in the ED until their evaluation is complete.     Dorothyann Peng, PA-C 08/20/22 2229

## 2022-08-21 NOTE — ED Notes (Signed)
Pt stated she was leaving AMA due to wait

## 2022-09-28 IMAGING — US US MFM OB FOLLOW-UP
1 series · 13 of 28 positions shown · non-contrast
Comparison: none

[Series 1: us mfm ob follow-up · 13 of 57 slices shown]
[im 3/57]
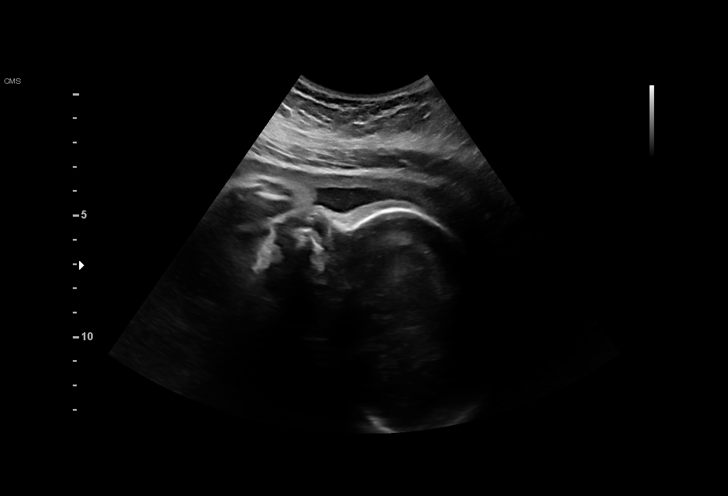
[im 7/57]
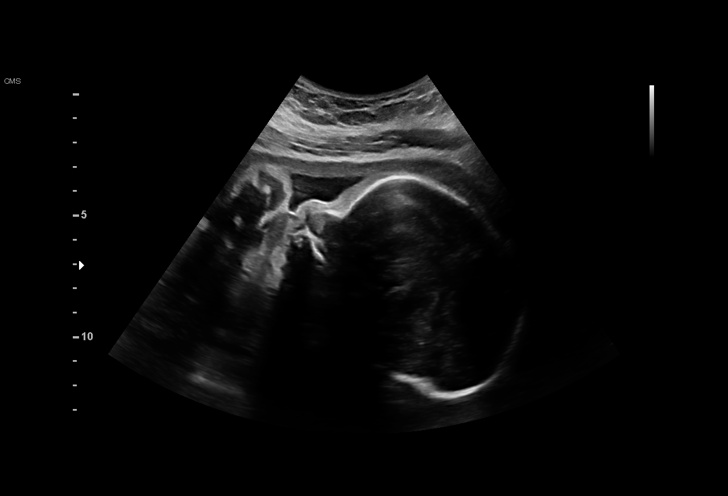
[im 11/57]
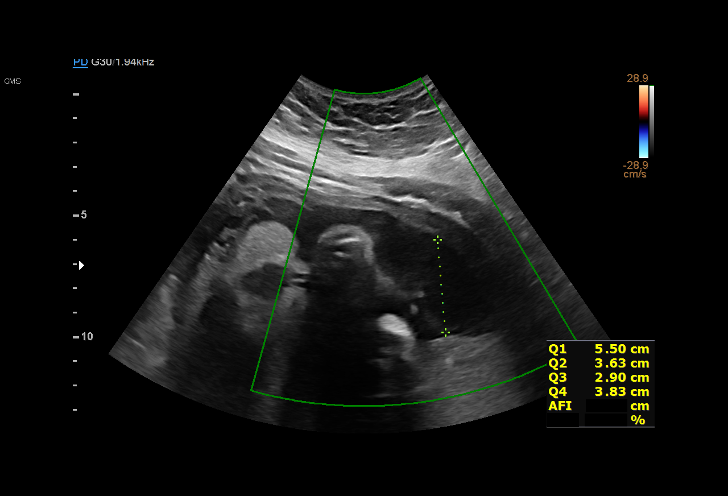
[im 15/57]
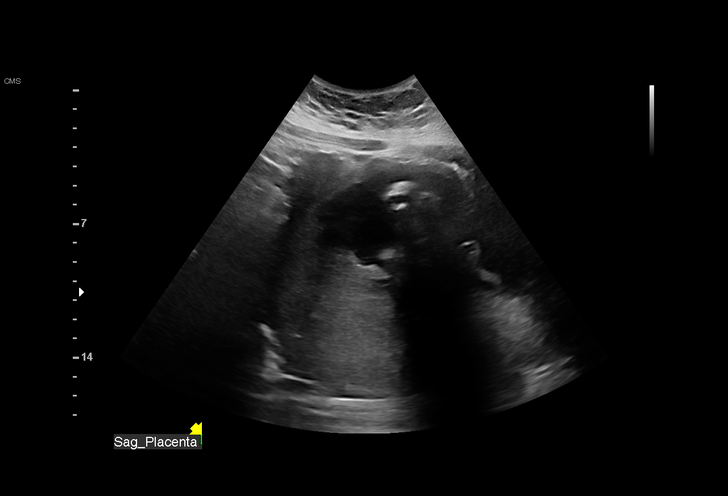
[im 19/57]
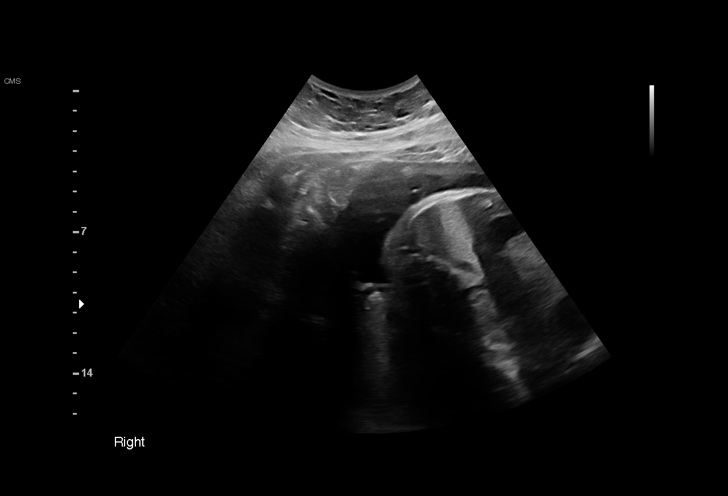
[im 23/57]
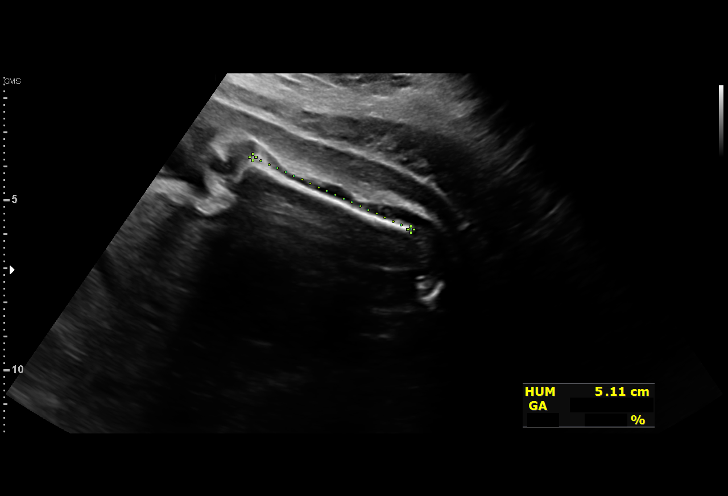
[im 30/57]
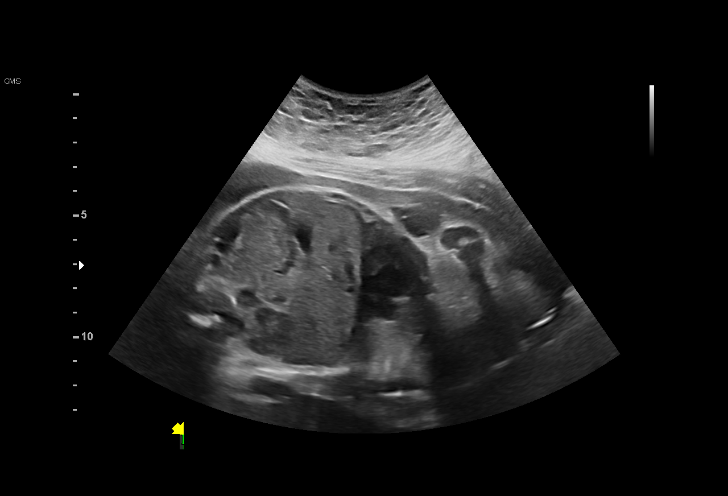
[im 34/57]
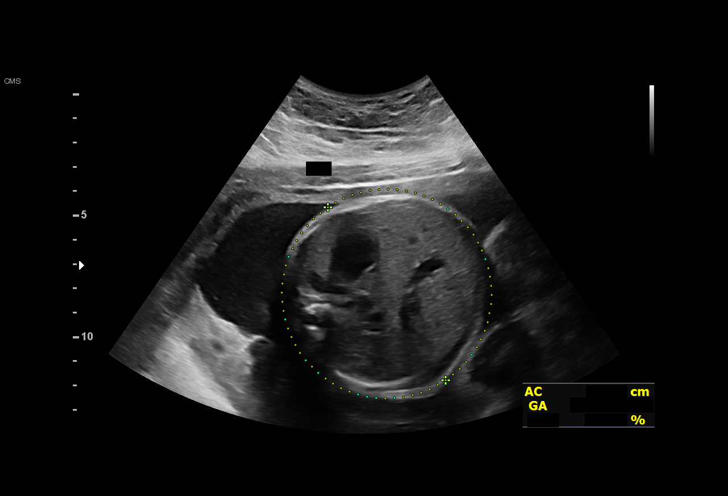
[im 38/57]
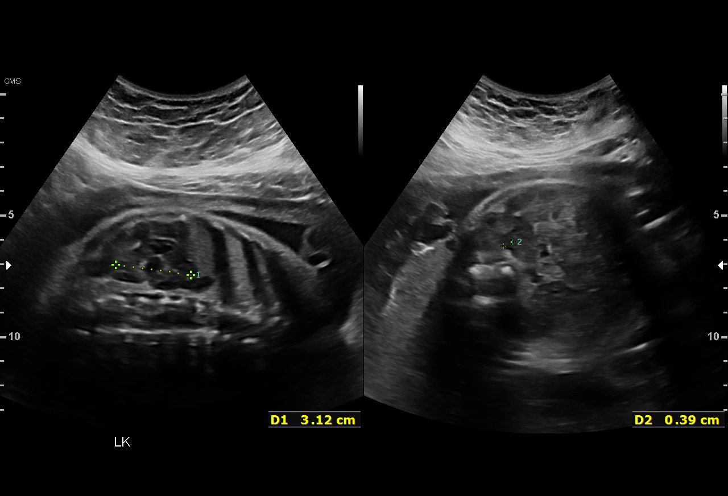
[im 42/57]
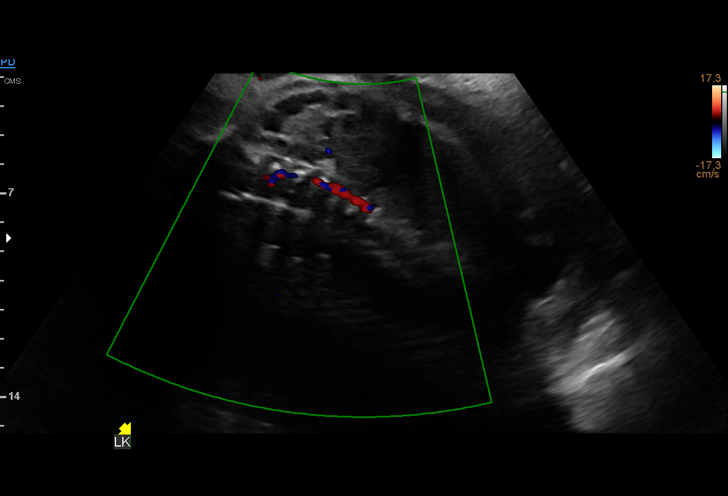
[im 46/57]
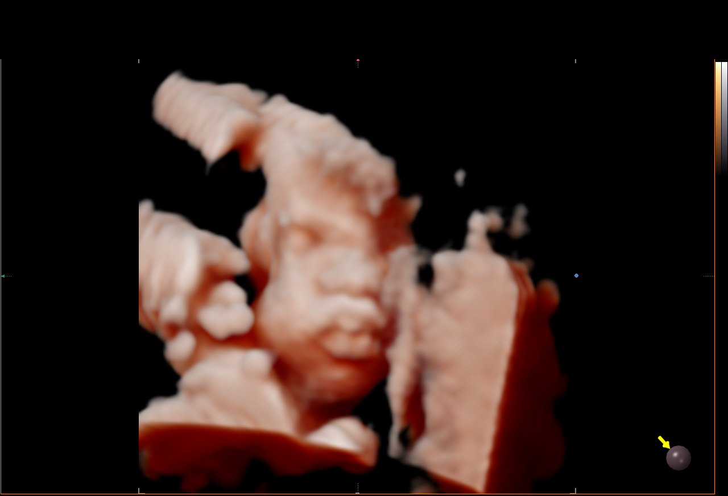
[im 50/57]
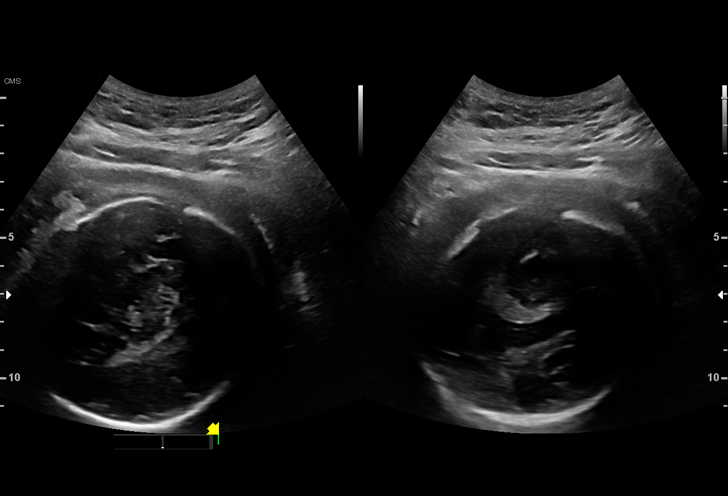
[im 54/57]
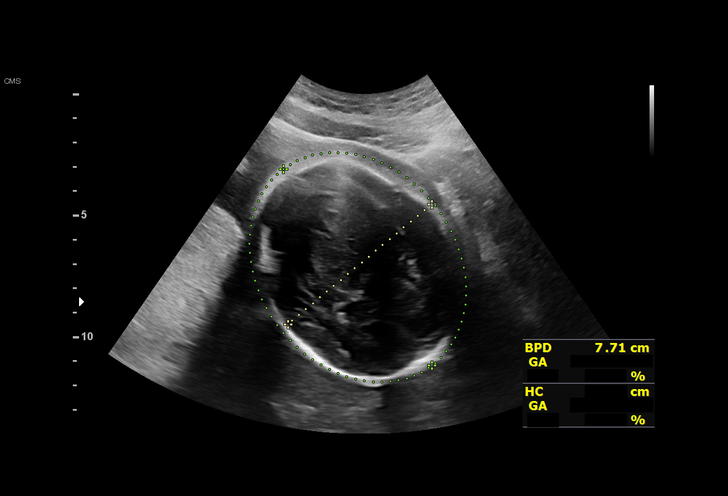

[13 of 28 positions shown; findings below may reference images not displayed]

Indications

 30 weeks gestation of pregnancy
 Hypertension - Chronic/Pre-existing (on
 Labetalol)
 Obesity complicating pregnancy, third
 trimester
 Uterine fibroids affecting pregnancy in third  O34.13,
 trimester, antepartum
 Encounter for other antenatal screening
 follow-up
 Abnormal fetal ultrasound (single umbilical
 artery)
Fetal Evaluation

 Num Of Fetuses:         1
 Fetal Heart Rate(bpm):  158
 Cardiac Activity:       Observed
 Presentation:           Cephalic
 Placenta:               Posterior
 P. Cord Insertion:      Previously Visualized

 Amniotic Fluid
 AFI FV:      Within normal limits

 AFI Sum(cm)     %Tile       Largest Pocket(cm)
 15.8            57

 RUQ(cm)       RLQ(cm)       LUQ(cm)        LLQ(cm)

Biometry

 BPD:      74.7  mm     G. Age:  30w 0d         24  %    CI:        71.33   %    70 - 86
                                                         FL/HC:      20.0   %    19.2 -
 HC:      281.7  mm     G. Age:  30w 6d         26  %    HC/AC:      1.04        0.99 -
 AC:      270.3  mm     G. Age:  31w 1d         67  %    FL/BPD:     75.2   %    71 - 87
 FL:       56.2  mm     G. Age:  29w 4d         15  %    FL/AC:      20.8   %    20 - 24
 HUM:      51.2  mm     G. Age:  30w 0d         43  %

 Est. FW:    5874  gm      3 lb 8 oz     40  %
OB History

 Gravidity:    1         Term:   0
 Living:       0
Gestational Age

 U/S Today:     30w 3d                                        EDD:   03/12/21
 Best:          30w 3d     Det. By:  Previous Ultrasound      EDD:   03/12/21
                                     (10/05/20)
Anatomy

 Cranium:               Appears normal         LVOT:                   Appears normal
 Cavum:                 Previously seen        Aortic Arch:            Previously seen
 Ventricles:            Previously seen        Ductal Arch:            Appears normal
 Choroid Plexus:        Previously seen        Diaphragm:              Appears normal
 Cerebellum:            Previously seen        Stomach:                Appears normal, left
                                                                       sided
 Posterior Fossa:       Previously seen        Abdomen:                Previously seen
 Nuchal Fold:           Not applicable (>20    Abdominal Wall:         Previously seen
                        wks GA)
 Face:                  Appears normal         Cord Vessels:           2 vessel cord,
                        (orbits and profile)
                                                                       absent Caleb Aujla
 Lips:                  Appears normal         Kidneys:                Appear normal
 Palate:                Previously seen        Bladder:                Appears normal
 Thoracic:              Appears normal         Spine:                  Previously seen
 Heart:                 Appears normal         Upper Extremities:      Previously seen
                        (4CH, axis, and
                        situs)
 RVOT:                  Appears normal         Lower Extremities:      Previously seen

 Other:  Fetus appears to be female. Nasal bone visualized. Heels/feet and
         open hands/5th digits previously visualized. VC, 3VV and 3VTV
         previously visualized.
Cervix Uterus Adnexa

 Cervix
 Not visualized (advanced GA >14wks)

 Uterus
 No abnormality visualized.

 Right Ovary
 Not visualized.
 Left Ovary
 Not visualized.

 Cul De Sac
 No free fluid seen.

 Adnexa
 No abnormality visualized.
Impression

 Chronic hypertension.  Patient takes labetalol for control.
 Blood pressure today at her office is 132/75 mmHg.  She has
 not had screening for gestational diabetes.
 Fetal growth is appropriate for gestational age.  Amniotic fluid
 is normal and good fetal activity seen.  Single umbilical artery
 is seen again.

Recommendations

 -An appointment was made for her to return in 2 weeks for
 BPP.
 -Weekly BPP from 32 weeks gestation till delivery.
                 Pallares, Dalvin

## 2022-11-09 ENCOUNTER — Other Ambulatory Visit: Payer: Self-pay | Admitting: Physician Assistant

## 2022-11-09 DIAGNOSIS — Z1231 Encounter for screening mammogram for malignant neoplasm of breast: Secondary | ICD-10-CM
